# Patient Record
Sex: Female | Born: 1970 | Race: Black or African American | Hispanic: No | Marital: Single | Smoking: Never smoker
Health system: Southern US, Community
[De-identification: ages and names within clinical notes are randomized; demographics above are authoritative.]

## PROBLEM LIST (undated history)

## (undated) DIAGNOSIS — I10 Essential (primary) hypertension: Secondary | ICD-10-CM

## (undated) DIAGNOSIS — E119 Type 2 diabetes mellitus without complications: Secondary | ICD-10-CM

## (undated) DIAGNOSIS — J45909 Unspecified asthma, uncomplicated: Secondary | ICD-10-CM

---

## 2014-01-09 ENCOUNTER — Encounter (HOSPITAL_BASED_OUTPATIENT_CLINIC_OR_DEPARTMENT_OTHER): Payer: Self-pay | Admitting: Emergency Medicine

## 2014-01-09 ENCOUNTER — Emergency Department (HOSPITAL_BASED_OUTPATIENT_CLINIC_OR_DEPARTMENT_OTHER)
Admission: EM | Admit: 2014-01-09 | Discharge: 2014-01-09 | Disposition: A | Discharge status: Home / Self Care | Payer: Self-pay | Attending: Emergency Medicine | Admitting: Emergency Medicine

## 2014-01-09 DIAGNOSIS — Z79899 Other long term (current) drug therapy: Secondary | ICD-10-CM | POA: Insufficient documentation

## 2014-01-09 DIAGNOSIS — IMO0002 Reserved for concepts with insufficient information to code with codable children: Secondary | ICD-10-CM | POA: Insufficient documentation

## 2014-01-09 DIAGNOSIS — I10 Essential (primary) hypertension: Secondary | ICD-10-CM | POA: Insufficient documentation

## 2014-01-09 DIAGNOSIS — Z792 Long term (current) use of antibiotics: Secondary | ICD-10-CM | POA: Insufficient documentation

## 2014-01-09 DIAGNOSIS — E119 Type 2 diabetes mellitus without complications: Secondary | ICD-10-CM | POA: Insufficient documentation

## 2014-01-09 DIAGNOSIS — Z794 Long term (current) use of insulin: Secondary | ICD-10-CM | POA: Insufficient documentation

## 2014-01-09 DIAGNOSIS — J45909 Unspecified asthma, uncomplicated: Secondary | ICD-10-CM | POA: Insufficient documentation

## 2014-01-09 DIAGNOSIS — J020 Streptococcal pharyngitis: Secondary | ICD-10-CM | POA: Insufficient documentation

## 2014-01-09 HISTORY — DX: Type 2 diabetes mellitus without complications: E11.9

## 2014-01-09 HISTORY — DX: Unspecified asthma, uncomplicated: J45.909

## 2014-01-09 HISTORY — DX: Essential (primary) hypertension: I10

## 2014-01-09 LAB — RAPID STREP SCREEN (MED CTR MEBANE ONLY): Streptococcus, Group A Screen (Direct): POSITIVE — AB

## 2014-01-09 MED ORDER — OXYCODONE-ACETAMINOPHEN 5-325 MG PO TABS: 1.0000 | ORAL_TABLET | ORAL | Status: DC | PRN

## 2014-01-09 MED ORDER — PENICILLIN V POTASSIUM 500 MG PO TABS: 1000.0000 mg | ORAL_TABLET | Freq: Two times a day (BID) | ORAL | Status: DC

## 2014-01-09 MED ORDER — PREDNISONE 20 MG PO TABS: 60.0000 mg | ORAL_TABLET | Freq: Every day | ORAL | Status: AC

## 2014-01-09 NOTE — ED Notes (Signed)
PA at bedside.

## 2014-01-09 NOTE — Discharge Instructions (Signed)
Use tylenol or motrin for pain and fever. You may use Percocet as prescribed for severe pain. Take all antibiotics as prescribed, until you've taken all of the antibiotics given to you. Stay well hydrated. You will be given Prednisone to help with your tonsil swelling and pain. Take this at breakfast time for the next three days. Follow up in one week with a primary care doctor, using the resource guide below to find one. Return to the emergency department if your pain worsens, you cannot swallow and are drooling, or you have difficulty breathing.   Pharyngitis Pharyngitis is redness, pain, and swelling (inflammation) of your pharynx.  CAUSES  Pharyngitis is usually caused by infection. Most of the time, these infections are from viruses (viral) and are part of a cold. However, sometimes pharyngitis is caused by bacteria (bacterial). Pharyngitis can also be caused by allergies. Viral pharyngitis may be spread from person to person by coughing, sneezing, and personal items or utensils (cups, forks, spoons, toothbrushes). Bacterial pharyngitis may be spread from person to person by more intimate contact, such as kissing.  SIGNS AND SYMPTOMS  Symptoms of pharyngitis include:   Sore throat.   Tiredness (fatigue).   Low-grade fever.   Headache.  Joint pain and muscle aches.  Skin rashes.  Swollen lymph nodes.  Plaque-like film on throat or tonsils (often seen with bacterial pharyngitis). DIAGNOSIS  Your health care provider will ask you questions about your illness and your symptoms. Your medical history, along with a physical exam, is often all that is needed to diagnose pharyngitis. Sometimes, a rapid strep test is done. Other lab tests may also be done, depending on the suspected cause.  TREATMENT  Viral pharyngitis will usually get better in 3-4 days without the use of medicine. Bacterial pharyngitis is treated with medicines that kill germs (antibiotics).  HOME CARE INSTRUCTIONS    Drink enough water and fluids to keep your urine clear or pale yellow.   Only take over-the-counter or prescription medicines as directed by your health care provider:   If you are prescribed antibiotics, make sure you finish them even if you start to feel better.   Do not take aspirin.   Get lots of rest.   Gargle with 8 oz of salt water ( tsp of salt per 1 qt of water) as often as every 1-2 hours to soothe your throat.   Throat lozenges (if you are not at risk for choking) or sprays may be used to soothe your throat. SEEK MEDICAL CARE IF:   You have large, tender lumps in your neck.  You have a rash.  You cough up green, yellow-brown, or bloody spit. SEEK IMMEDIATE MEDICAL CARE IF:   Your neck becomes stiff.  You drool or are unable to swallow liquids.  You vomit or are unable to keep medicines or liquids down.  You have severe pain that does not go away with the use of recommended medicines.  You have trouble breathing (not caused by a stuffy nose). MAKE SURE YOU:   Understand these instructions.  Will watch your condition.  Will get help right away if you are not doing well or get worse. Document Released: 07/09/2005 Document Revised: 04/29/2013 Document Reviewed: 03/16/2013 [facility] Patient Information 2015 [place], [place]. This information is not intended to replace advice given to you by your health care provider. Make sure you discuss any questions you have with your health care provider.  Emergency Department Resource Guide 1) Find a Doctor and Pay Out  of Pocket Although you won't have to find out who is covered by your insurance plan, it is a good idea to ask around and get recommendations. You will then need to call the office and see if the doctor you have chosen will accept you as a new patient and what types of options they offer for patients who are self-pay. Some doctors offer discounts or will set up payment plans for their patients who do not  have insurance, but you will need to ask so you aren't surprised when you get to your appointment.  2) Contact Your Local Health Department Not all health departments have doctors that can see patients for sick visits, but many do, so it is worth a call to see if yours does. If you don't know where your local health department is, you can check in your phone book. The CDC also has a tool to help you locate your state's health department, and many state websites also have listings of all of their local health departments.  3) Find a Walk-in Clinic If your illness is not likely to be very severe or complicated, you may want to try a walk in clinic. These are popping up all over the country in pharmacies, drugstores, and shopping centers. They're usually staffed by nurse practitioners or physician assistants that have been trained to treat common illnesses and complaints. They're usually fairly quick and inexpensive. However, if you have serious medical issues or chronic medical problems, these are probably not your best option.  No Primary Care Doctor: - Call Health Connect at  [phone] - they can help you locate a primary care doctor that  accepts your insurance, provides certain services, etc. - Physician Referral Service- [phone]  Chronic Pain Problems: Organization         Address  Phone   Notes  [facility]  [phone]) [phone] Patients need to be referred by their primary care doctor.   Medication Assistance: Organization         Address  Phone   Notes  [facility] Medication [facility] [address] [place], [place] [address] [phone] --Must be a resident of [facility] [place] -- Must have NO insurance coverage whatsoever (no Medicaid/ Medicare, etc.) -- The pt. MUST have a primary care doctor that directs their care regularly and follows them in the community   MedAssist  [phone]   [employer]  [phone]    Agencies that  provide inexpensive medical care: Organization         Address  Phone   Notes  [facility]  [phone]   [facility]    [phone]   [facility] [address] [place], [place] [address] [phone]   [facility] [place] [address], [place] [phone]   Planned Parenthood    [phone]) [phone]   [facility]    [phone]) [phone]   [facility]  [address], [place] Phone:  [phone]) [phone], Fax:  [phone] Hours of Operation:  9 am - 6 pm, M-F.  Also accepts Medicaid/Medicare and self-pay.  [facility]  [address], [place] Phone: [phone], Fax: [phone]. Hours of Operation:  8:30 am - 5:30 pm, M-F.  Also accepts Medicaid and self-pay.  [facility] [place] [address], [place] Phone: [phone](336) [phone]   [facility] [address], [place], [place] [phone], Ext. [phone] Mondays & Thursdays: 7-9 AM.  First 15 patients are seen on a first come, first serve basis.    Medicaid-accepting [facility] Providers:  Organization         Address  Phone   Notes  [facility] [address], [place] [phone] Also accepts self-pay patients.  [facility] [address], [place]  [phone]) [phone]   [facility] [address], [place] [phone]   [facility] [address] [place] [address], [place] [phone]) [phone]   Renaye RakersVeita Bland [address], [place]   [phone] Only accepts [place] Access [place] patients after they have their name applied to their card.   Self-Pay (no insurance) in [facility]:  Organization         Address  Phone   Notes  Sickle Cell Patients, [facility] [address], [place] [phone]) [phone]   [facility] [address], [place] [phone]) [phone]   [facility] [place]  [address], [place] [phone]   [facility]/Dr. Osei-Bonsu  [address] [place] [address], [place] or [phone], [place] [phone] Phone number for both [place] and [place] locations is the same.  [facility] and [facility] [address], [place] [phone]   [facility] [place] [address] [place] [address], [place] or [address] [phone]) [phone]   [facility] [address], [place] [phone], phone; [phone]) [phone], fax Sees patients 1st and 3rd Saturday of every month.  Must not qualify for public or private insurance (i.e. Medicaid, Medicare, [place] Health Choice, Veterans' Benefits)  Household income should be no more than 200% of the poverty level The clinic cannot treat you if you are pregnant or think you are pregnant  Sexually transmitted diseases are not treated at the clinic.    Dental Care: Organization         Address  Phone  Notes  [facility] Department of [facility] [address], [place] [phone] Accepts children up to age 41 who are enrolled in [place]icaid or [place] Health Choice; pregnant women with a Medicaid card; and children who have applied for Medicaid or [place] Health Choice, but were declined, whose parents can pay a reduced fee at time of service.  [facility] of [facility] [place]  [address], [place] [phone] Accepts children up to age 41 who are enrolled in [place] or [place] Health Choice; pregnant women with a Medicaid card; and children who have applied for Medicaid or [place] Health Choice, but were declined, whose parents can pay a reduced fee at time of service.  [facility]  [address], [place] [phone] Patients are seen by appointment only. Walk-ins  are not accepted. [facility] will see patients 10518 years of age and older. Monday - Tuesday (8am-5pm) Most Wednesdays (8:30-5pm) $30 per visit, cash only  [facility]  [address], [facility] [phone] Patients are seen by appointment only. Walk-ins are not accepted. [facility] will see patients 41 years of age and older. One Wednesday Evening (Monthly: Volunteer Based).  $30 per visit, cash  only  [employer] of Dentistry Clinics  [phone] for adults; Children under age 8, call Graduate Pediatric Dentistry at [phone]. Children aged 64-14, please call [phone] to request a pediatric application.  Dental services are provided in all areas of dental care including fillings, crowns and bridges, complete and partial dentures, implants, gum treatment, root canals, and extractions. Preventive care is also provided. Treatment is provided to both adults and children. Patients are selected via a lottery and there is often a waiting list.   [facility] [address] [place]  [phone] [email or web address]   [facility] [address] [place], [place] [phone], Ext. [phone] Second and Fourth Thursday of each month, opens at 6:30 AM; Clinic ends at 9 AM.  Patients are seen on a first-come first-served basis, and a limited number are seen during each clinic.   [facility]  [address] [place], [place] [phone]   Eligibility Requirements You must have lived in [place], [place], or [place] counties for at least the last three months.   You cannot be eligible for state or federal sponsored [place], including [employer], [place], or [employer].   You generally cannot be eligible for healthcare insurance through your employer.    How to apply: Eligibility screenings are held every Tuesday and Wednesday afternoon from 1:00 pm until 4:00 pm. You do not need an appointment  for the interview!  [facility] [address], [place], [place] [phone]   [facility] Health Department  [phone]   [facility] Health Department  [phone]   [facility] Health Department  [phone]    Behavioral Health Resources in the Community: Intensive Outpatient Programs Organization         Address  Phone  Notes  [facility] [address] [place], [place], [place] [phone]   [facility] [place] [facility] [address] [place], [place] [phone]   [facility] [address] [place], [place]  [phone]   [facility] [address]  [place], [place] [phone] or [phone]   Substance Abuse Resources Organization         Address  Phone  Notes  [facility] Drug Services  [phone]   [facility]  [phone]   [facility]  [phone]   Floydene Flock  [phone]   Residential & Outpatient Substance Abuse Program  [phone]   Psychological Services Organization         Address  Phone  Notes  [facility]  [phone]   [facility]  [phone]   [facility] [address] [place] [phone] or [phone]    Mobile Crisis Teams Organization         Address  Phone  Notes  [facility], Mobile Crisis Care Unit  [phone]   [facility]  [address] [place], [place] [phone]   Doristine Locks [address] [place] [phone]    Self-Help/Support Groups Organization         Address  Phone             Notes  [facility] [place] - variety of support groups  336- I7437963 Call for more information  Narcotics Anonymous (NA), [facility] [address], [employer] [place]  2 meetings at this location   Statistician  Address  Phone  Notes  [facility] Residential  Treatment [address],    [place]  [phone]   [facility]  [address], [place] 161096, [place], [place]   Salcha Rogers, [place] [phone] Admissions: 8am-3pm M-F  Incentives Substance [place] [address],    [place], [place] [phone]   [facility] [address] [place], [place], [place]   [facility] [address]  [place], [place]   [facility] Programs - Intensive Outpatient [address], [place], [place]   [facility] ([place] [place].,  [place], [place] [phone] or [phone]   [facility] (RTS) [address] [place], [place] Accepts Medicaid  [facility] [place] [address].,  [place] [phone] Substance Abuse/Addiction Treatment   [facility] Organization         Address  Phone  Notes  [facility]  [phone]   Domenic Schwab, PhD [address] Arlis Porta [place], [place]   [phone] or [phone]   [place] [address] [place], [place] [phone]   [facility] [address] [place], [place] [phone] Insurance/Medicaid/sponsorship through [facility] and Families [address]                                    [place], [place] [phone] [place] [address], [place] [phone]    Dr. Adele Schilder  [phone]   [facility]. 1) [address] [place] 2) [place] 3)  [place] [address], [place] [phone]  [phone]   [place] [phone] or [phone] (After Hours)

## 2014-01-09 NOTE — ED Notes (Signed)
Pt with sore throat, fever, aches for four days.

## 2014-01-09 NOTE — ED Provider Notes (Signed)
CSN: [id number]     Arrival date & time 01/09/14  1449 History   First MD Initiated Contact with Patient 01/09/14 1529     Chief Complaint  Patient presents with  . Sore Throat     (Consider location/radiation/quality/duration/timing/severity/associated sxs/prior Treatment) HPI Comments: Denise Solis is a 43 y.o. Female who presents today with 4 days of sore throat and hoarseness associated with neck soreness, rhinorrhea/congestion, and HA. Throat pain worse with coughing or eating, tried Tylenol and OTC lozenges/sprays with no relief. No known alleviating factors. Endorses fevers at home, TMax 102F. Endorses myalgias. Denies cough, abd pain, N/V/D, arthralgias, eye pain/discharge/itching, trismus, drooling, or paresthesias/weakness. 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 Has never had this before, UTD on vaccinations, and still has tonsils. Last dose of Tylenol was 3hrs PTA.  Patient is a 43 y.o. female presenting with pharyngitis. The history is provided by the patient. No language interpreter was used.  Sore Throat This is a new problem. The current episode started in the past 7 days. The problem occurs constantly. The problem has been unchanged. Associated symptoms include congestion, fatigue, a fever (Tmax 102 at home), headaches, myalgias, neck pain, a sore throat and swollen glands. Pertinent negatives include no abdominal pain, anorexia, arthralgias, chest pain, chills, coughing, joint swelling, nausea, numbness, rash, urinary symptoms, vomiting or weakness. The symptoms are aggravated by drinking, coughing and swallowing. She has tried acetaminophen, rest and sleep for the symptoms. The treatment provided mild relief.    Past Medical History  Diagnosis Date  . Hypertension   . Diabetes mellitus without complication   . Asthma    Past Surgical History  Procedure Laterality Date  . Cesarean section     History reviewed. No pertinent family history. History   Substance Use Topics  . Smoking status: Never Smoker   . Smokeless tobacco: Not on file  . Alcohol Use: No   OB History   Grav Para Term Preterm Abortions TAB SAB Ect Mult Living                 Review of Systems  Constitutional: Positive for fever (Tmax 102 at home) and fatigue. Negative for chills and appetite change.  HENT: Positive for congestion, rhinorrhea, sinus pressure, sore throat and voice change. Negative for drooling, ear pain, mouth sores, nosebleeds, postnasal drip, sneezing and trouble swallowing (pain with swallowing, but able to control secretions).   Eyes: Negative for photophobia, pain, discharge, redness, itching and visual disturbance.  Respiratory: Negative for cough, choking, chest tightness, shortness of breath, wheezing and stridor.   Cardiovascular: Negative for chest pain.  Gastrointestinal: Negative for nausea, vomiting, abdominal pain, diarrhea, constipation and anorexia.  Musculoskeletal: Positive for myalgias and neck pain. Negative for arthralgias, joint swelling and neck stiffness.  Skin: Negative for rash.  Neurological: Positive for headaches. Negative for dizziness, weakness and numbness.  10 Systems reviewed and are negative for acute change except as noted in the HPI.     Allergies  Review of patient's allergies indicates no known allergies.  Home Medications   Prior to Admission medications   Medication Sig Start Date End Date Taking? Authorizing Provider  albuterol (PROVENTIL HFA;VENTOLIN HFA) 108 (90 BASE) MCG/ACT inhaler Inhale into the lungs every 6 (six) hours as needed for wheezing or shortness of breath.   Yes Historical Provider, MD  amLODipine (NORVASC) 5 MG tablet Take 5 mg by mouth daily.   Yes Historical Provider, MD  beclomethasone (QVAR) 40 MCG/ACT inhaler Inhale  into the lungs 2 (two) times daily.   Yes Historical Provider, MD  ferrous sulfate 325 (65 FE) MG tablet Take 325 mg by mouth daily with breakfast.   Yes Historical  Provider, MD  gabapentin (NEURONTIN) 300 MG capsule Take 300 mg by mouth 3 (three) times daily.   Yes Historical Provider, MD  glipiZIDE (GLUCOTROL XL) 10 MG 24 hr tablet Take 10 mg by mouth daily with breakfast.   Yes Historical Provider, MD  insulin aspart (NOVOLOG) 100 UNIT/ML injection Inject into the skin as needed for high blood sugar.   Yes Historical Provider, MD  insulin detemir (LEVEMIR) 100 UNIT/ML injection Inject into the skin at bedtime.   Yes Historical Provider, MD  losartan (COZAAR) 50 MG tablet Take 50 mg by mouth daily.   Yes Historical Provider, MD  ranitidine (ZANTAC) 150 MG tablet Take 150 mg by mouth 2 (two) times daily.   Yes Historical Provider, MD  oxyCODONE-acetaminophen (PERCOCET) 5-325 MG per tablet Take 1-2 tablets by mouth every 4 (four) hours as needed for severe pain. 01/09/14   Mercedes Strupp Camprubi-Soms, PA-C  penicillin v potassium (VEETID) 500 MG tablet Take 2 tablets (1,000 mg total) by mouth 2 (two) times daily. X 7 days 01/09/14   Donnita FallsMercedes Strupp Camprubi-Soms, PA-C  predniSONE (DELTASONE) 20 MG tablet Take 3 tablets (60 mg total) by mouth daily with breakfast. X 3 days 01/09/14   Donnita FallsMercedes Strupp Camprubi-Soms, PA-C   BP 130/74  Pulse 104  Temp(Src) 98.6 F (37 C) (Oral)  Resp 20  Ht 5\' 1"  (1.549 m)  Wt 194 lb (87.998 kg)  BMI 36.67 kg/m2  SpO2 100%  LMP 12/26/2013 Physical Exam  Nursing note and vitals reviewed. Constitutional: She is oriented to person, place, and time. Vital signs are normal. She appears well-developed and well-nourished. No distress.  WDWN in NAD, but appears uncomfortable  HENT:  Head: Normocephalic and atraumatic.  Right Ear: Tympanic membrane, external ear and ear canal normal.  Left Ear: Tympanic membrane, external ear and ear canal normal.  Nose: Mucosal edema and rhinorrhea present. No sinus tenderness or septal deviation. No epistaxis. Right sinus exhibits no maxillary sinus tenderness and no frontal sinus tenderness.  Left sinus exhibits no maxillary sinus tenderness and no frontal sinus tenderness.  Mouth/Throat: Uvula is midline and mucous membranes are normal. No trismus in the jaw. No uvula swelling. Oropharyngeal exudate and posterior oropharyngeal erythema present. No tonsillar abscesses.  Homer/AT, b/l ear canals clear with no erythema or edema, no pain with tugging on pinna, b/l TMs free of erythema and good landmarks seen.  B/L nasal turbinates edematous and erythematous with clear rhinorrhea. Maxillary and frontal sinuses non-tender b/l. Uvula midline, tonsils erythematous and trace exudates on right tonsil, no peritonsillar abscess noted, no trismus or muffled voice. Voice is hoarse.  Eyes: Conjunctivae and EOM are normal. Pupils are equal, round, and reactive to light. Right eye exhibits no discharge. Left eye exhibits no discharge.  Neck: Normal range of motion. Neck supple. No spinous process tenderness and no muscular tenderness present. No rigidity.  Cardiovascular: Normal rate, regular rhythm, normal heart sounds and intact distal pulses.   Pulmonary/Chest: Effort normal and breath sounds normal. No accessory muscle usage or stridor. No respiratory distress. She has no wheezes. She has no rhonchi. She has no rales.  CTAB in all lung fields. No stridor.  Musculoskeletal: Normal range of motion.  Lymphadenopathy:       Head (right side): Submental, submandibular and tonsillar adenopathy present. No  preauricular, no posterior auricular and no occipital adenopathy present.       Head (left side): Submental, submandibular and tonsillar adenopathy present. No preauricular, no posterior auricular and no occipital adenopathy present.    She has cervical adenopathy.       Right cervical: Superficial cervical and deep cervical adenopathy present. No posterior cervical adenopathy present.      Left cervical: Superficial cervical and deep cervical adenopathy present. No posterior cervical adenopathy present.        Right: No supraclavicular adenopathy present.       Left: No supraclavicular adenopathy present.  Diffuse anterior cervical LAD, exquisitely tender to palpation. Submental, submandibular, and tonsillar LAD. No posterior cervical, preauricular, postauricular, or occipital LAD.  Neurological: She is alert and oriented to person, place, and time.  Skin: Skin is warm, dry and intact. No rash noted.  Psychiatric: She has a normal mood and affect.    ED Course  Procedures (including critical care time) Labs Review Labs Reviewed  RAPID STREP SCREEN - Abnormal; Notable for the following:    Streptococcus, Group A Screen (Direct) POSITIVE (*)    All other components within normal limits    Imaging Review No results found.   EKG Interpretation None      MDM   Final diagnoses:  Strep pharyngitis    Denise Solis is a 43 y.o. female presenting with 4 days of sore throat, congestion, and URI symptoms. Rapid strep sent, but high suspicion based on CENTOR criteria. Low clinical suspicion for peritonsillar abscess at this time.   4:30 PM Rapid strep +. Will give RX for PCN VK and prednisone burst to help with pain, as well as Percocet. Advised good oral hydration. Pt given return precautions. Follow up with PCP in 3-5days. Pt understands and agrees with plan. Stable at d/c.  BP 130/74  Pulse 104  Temp(Src) 98.6 F (37 C) (Oral)  Resp 20  Ht 5\' 1"  (1.549 m)  Wt 194 lb (87.998 kg)  BMI 36.67 kg/m2  SpO2 100%  LMP 12/26/2013     Donnita Falls Camprubi-Soms, PA-C 01/09/14 1634

## 2014-01-09 NOTE — ED Notes (Signed)
Pt reports fever. Sts taking tylenol with some relief.  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  Reports pain in throat.

## 2014-01-11 NOTE — ED Provider Notes (Signed)
Medical screening examination/treatment/procedure(s) were performed by non-physician practitioner and as supervising physician I was immediately available for consultation/collaboration.   EKG Interpretation None       Hurman HornJohn M Bednar, MD 01/11/14 1421

## 2014-07-23 ENCOUNTER — Encounter (HOSPITAL_BASED_OUTPATIENT_CLINIC_OR_DEPARTMENT_OTHER): Payer: Self-pay | Admitting: *Deleted

## 2014-07-23 ENCOUNTER — Emergency Department (HOSPITAL_BASED_OUTPATIENT_CLINIC_OR_DEPARTMENT_OTHER)
Admission: EM | Admit: 2014-07-23 | Discharge: 2014-07-23 | Disposition: A | Discharge status: Home / Self Care | Payer: Self-pay | Attending: Emergency Medicine | Admitting: Emergency Medicine

## 2014-07-23 DIAGNOSIS — J45909 Unspecified asthma, uncomplicated: Secondary | ICD-10-CM | POA: Insufficient documentation

## 2014-07-23 DIAGNOSIS — K088 Other specified disorders of teeth and supporting structures: Secondary | ICD-10-CM | POA: Insufficient documentation

## 2014-07-23 DIAGNOSIS — K0889 Other specified disorders of teeth and supporting structures: Secondary | ICD-10-CM

## 2014-07-23 DIAGNOSIS — E119 Type 2 diabetes mellitus without complications: Secondary | ICD-10-CM | POA: Insufficient documentation

## 2014-07-23 DIAGNOSIS — Z79899 Other long term (current) drug therapy: Secondary | ICD-10-CM | POA: Insufficient documentation

## 2014-07-23 DIAGNOSIS — I10 Essential (primary) hypertension: Secondary | ICD-10-CM | POA: Insufficient documentation

## 2014-07-23 DIAGNOSIS — Z794 Long term (current) use of insulin: Secondary | ICD-10-CM | POA: Insufficient documentation

## 2014-07-23 DIAGNOSIS — Z7951 Long term (current) use of inhaled steroids: Secondary | ICD-10-CM | POA: Insufficient documentation

## 2014-07-23 MED ORDER — OXYCODONE-ACETAMINOPHEN 5-325 MG PO TABS
1.0000 | ORAL_TABLET | Freq: Once | ORAL | Status: AC
  Administered 2014-07-23: 1 via ORAL
  Filled 2014-07-23: qty 1

## 2014-07-23 MED ORDER — OXYCODONE-ACETAMINOPHEN 5-325 MG PO TABS: 1.0000 | ORAL_TABLET | Freq: Four times a day (QID) | ORAL | Status: AC | PRN

## 2014-07-23 MED ORDER — PENICILLIN V POTASSIUM 500 MG PO TABS: 500.0000 mg | ORAL_TABLET | Freq: Three times a day (TID) | ORAL | Status: AC

## 2014-07-23 NOTE — ED Notes (Signed)
C/o lower left toothache x 4 days.

## 2014-07-23 NOTE — ED Provider Notes (Signed)
CSN: [id number]     Arrival date & time 07/23/14  0920 History   First MD Initiated Contact with Patient 07/23/14 [id number]     Chief Complaint  Patient presents with  . Dental Pain     (Consider location/radiation/quality/duration/timing/severity/associated sxs/prior Treatment) HPI  Pt presenting with c/o left lower dental pain.  Pain has been present for the past 4 days.  2 teeth have been broken off in that area, but several days ago she bit into something and the pain has been constant and worseing since that time.  No swelling under tongue.  No fever.  No difficulty breathing or swallowing.  Pain is constant but worse with cold liquids and chewing.  No fever/chills.  There are no other associated systemic symptoms, there are no other alleviating or modifying factors.   Past Medical History  Diagnosis Date  . Hypertension   . Diabetes mellitus without complication   . Asthma    Past Surgical History  Procedure Laterality Date  . Cesarean section     No family history on file. History  Substance Use Topics  . Smoking status: Never Smoker   . Smokeless tobacco: Not on file  . Alcohol Use: No   OB History    No data available     Review of Systems  ROS reviewed and all otherwise negative except for mentioned in HPI    Allergies  Review of patient's allergies indicates no known allergies.  Home Medications   Prior to Admission medications   Medication Sig Start Date End Date Taking? Authorizing Provider  albuterol (PROVENTIL HFA;VENTOLIN HFA) 108 (90 BASE) MCG/ACT inhaler Inhale into the lungs every 6 (six) hours as needed for wheezing or shortness of breath.   Yes Historical Provider, MD  amLODipine (NORVASC) 5 MG tablet Take 5 mg by mouth daily.   Yes Historical Provider, MD  beclomethasone (QVAR) 40 MCG/ACT inhaler Inhale into the lungs 2 (two) times daily.   Yes Historical Provider, MD  ferrous sulfate 325 (65 FE) MG tablet Take 325 mg by mouth daily with breakfast.    Yes Historical Provider, MD  gabapentin (NEURONTIN) 300 MG capsule Take 300 mg by mouth 3 (three) times daily.   Yes Historical Provider, MD  glipiZIDE (GLUCOTROL XL) 10 MG 24 hr tablet Take 10 mg by mouth daily with breakfast.   Yes Historical Provider, MD  insulin aspart (NOVOLOG) 100 UNIT/ML injection Inject into the skin as needed for high blood sugar.   Yes Historical Provider, MD  insulin detemir (LEVEMIR) 100 UNIT/ML injection Inject into the skin at bedtime.   Yes Historical Provider, MD  losartan (COZAAR) 50 MG tablet Take 50 mg by mouth daily.   Yes Historical Provider, MD  ranitidine (ZANTAC) 150 MG tablet Take 150 mg by mouth 2 (two) times daily.   Yes Historical Provider, MD  sulfamethoxazole-trimethoprim (BACTRIM DS,SEPTRA DS) 800-160 MG per tablet Take 1 tablet by mouth 2 (two) times daily.   Yes Historical Provider, MD  oxyCODONE-acetaminophen (PERCOCET/ROXICET) 5-325 MG per tablet Take 1-2 tablets by mouth every 6 (six) hours as needed for severe pain. 07/23/14   Ethelda Chick, MD  penicillin v potassium (VEETID) 500 MG tablet Take 1 tablet (500 mg total) by mouth 3 (three) times daily. 07/23/14   Ethelda Chick, MD  predniSONE (DELTASONE) 20 MG tablet Take 3 tablets (60 mg total) by mouth daily with breakfast. X 3 days 01/09/14   Don[place]ita [place] Camprubi-Soms, PA-C   BP 134/86 mmHg  Pulse 103  Temp(Src) 98.8 F (37.1 C) (Oral)  Resp 18  Ht  (1.549 m)  Wt 178 lb (80.74 kg)  BMI 33.65 kg/m2  SpO2 100%  LMP 07/20/2014  Vitals reviewed Physical Exam  Physical Examination: General appearance - alert, well appearing, and in no distress Mental status - alert, oriented to person, place, and time Eyes - no conjunctival injection, no scleral icterus Mouth - mucous membranes moist, pharynx normal without lesions, several eroded teeth down to gum line in left lower mandibular area, no swelling under tongue Neck - supple, no significant adenopathy Chest - clear to auscultation,  no wheezes, rales or rhonchi, symmetric air entry Heart - normal rate, regular rhythm, normal S1, S2, no murmurs, rubs, clicks or gallops Extremities - peripheral pulses normal, no pedal edema, no clubbing or cyanosis Skin - normal coloration and turgor, no rashes  ED Course  Procedures (including critical care time) Labs Review Labs Reviewed - No data to display  Imaging Review No results found.   EKG Interpretation None      MDM   Final diagnoses:  Pain, dental    Pt presenting with c/o left lower dental pain.  Pt has erosion and dental decay of left lower molars.  No gingival abscess.  No swelling under tongue.  Pt started on antibiotics, given pain medicaiton and strongly encouraged to arrange for followup with dentist.  Discharged with strict return precautions.  Pt agreeable with plan.    Ethelda Chick, MD 07/24/14 [phone]

## 2014-07-23 NOTE — Discharge Instructions (Signed)
Return to the ED with any concerns including difficulty breathing or swallowing, vomiting and not able to keep down liquids or antibiotics, decreased level of alertness/lethargy, or any other alarming symptoms °

## 2015-02-13 ENCOUNTER — Emergency Department (HOSPITAL_BASED_OUTPATIENT_CLINIC_OR_DEPARTMENT_OTHER)
Admission: EM | Admit: 2015-02-13 | Discharge: 2015-02-13 | Disposition: A | Discharge status: Home / Self Care | Payer: Self-pay | Attending: Emergency Medicine | Admitting: Emergency Medicine

## 2015-02-13 ENCOUNTER — Encounter (HOSPITAL_BASED_OUTPATIENT_CLINIC_OR_DEPARTMENT_OTHER): Payer: Self-pay | Admitting: *Deleted

## 2015-02-13 DIAGNOSIS — Z7951 Long term (current) use of inhaled steroids: Secondary | ICD-10-CM | POA: Insufficient documentation

## 2015-02-13 DIAGNOSIS — Z794 Long term (current) use of insulin: Secondary | ICD-10-CM | POA: Insufficient documentation

## 2015-02-13 DIAGNOSIS — G629 Polyneuropathy, unspecified: Secondary | ICD-10-CM | POA: Insufficient documentation

## 2015-02-13 DIAGNOSIS — J45909 Unspecified asthma, uncomplicated: Secondary | ICD-10-CM | POA: Insufficient documentation

## 2015-02-13 DIAGNOSIS — Z79899 Other long term (current) drug therapy: Secondary | ICD-10-CM | POA: Insufficient documentation

## 2015-02-13 DIAGNOSIS — E119 Type 2 diabetes mellitus without complications: Secondary | ICD-10-CM | POA: Insufficient documentation

## 2015-02-13 DIAGNOSIS — I10 Essential (primary) hypertension: Secondary | ICD-10-CM | POA: Insufficient documentation

## 2015-02-13 MED ORDER — TRAMADOL HCL 50 MG PO TABS: 50.0000 mg | ORAL_TABLET | Freq: Four times a day (QID) | ORAL | Status: AC | PRN

## 2015-02-13 MED ORDER — MORPHINE SULFATE 4 MG/ML IJ SOLN
4.0000 mg | Freq: Once | INTRAMUSCULAR | Status: AC
  Administered 2015-02-13: 4 mg via INTRAMUSCULAR
  Filled 2015-02-13: qty 1

## 2015-02-13 NOTE — ED Notes (Signed)
Pt c/o right foot pain w/o injury x 3 days 

## 2015-02-13 NOTE — Discharge Instructions (Signed)
Diabetic Neuropathy Diabetic neuropathy is a nerve disease or nerve damage that is caused by diabetes mellitus. About half of all people with diabetes mellitus have some form of nerve damage. Nerve damage is more common in those who have had diabetes mellitus for many years and who generally have not had good control of their blood sugar (glucose) level. Diabetic neuropathy is a common complication of diabetes mellitus. There are three more common types of diabetic neuropathy and a fourth type that is less common and less understood:   Peripheral neuropathy--This is the most common type of diabetic neuropathy. It causes damage to the nerves of the feet and legs first and then eventually the hands and arms.The damage affects the ability to sense touch.  Autonomic neuropathy--This type causes damage to the autonomic nervous system, which controls the following functions:  Heartbeat.  Body temperature.  Blood pressure.  Urination.  Digestion.  Sweating.  Sexual function.  Focal neuropathy--Focal neuropathy can be painful and unpredictable and occurs most often in older adults with diabetes mellitus. It involves a specific nerve or one area and often comes on suddenly. It usually does not cause long-term problems.  Radiculoplexus neuropathy-- Sometimes called lumbosacral radiculoplexus neuropathy, radiculoplexus neuropathy affects the nerves of the thighs, hips, buttocks, or legs. It is more common in people with type 2 diabetes mellitus and in older men. It is characterized by debilitating pain, weakness, and atrophy, usually in the thigh muscles. CAUSES  The cause of peripheral, autonomic, and focal neuropathies is diabetes mellitus that is uncontrolled and high glucose levels. The cause of radiculoplexus neuropathy is unknown. However, it is thought to be caused by inflammation related to uncontrolled glucose levels. SIGNS AND SYMPTOMS  Peripheral Neuropathy Peripheral neuropathy develops  slowly over time. When the nerves of the feet and legs no longer work there may be:   Burning, stabbing, or aching pain in the legs or feet.  Inability to feel pressure or pain in your feet. This can lead to:  Thick calluses over pressure areas.  Pressure sores.  Ulcers.  Foot deformities.  Reduced ability to feel temperature changes.  Muscle weakness. Autonomic Neuropathy The symptoms of autonomic neuropathy vary depending on which nerves are affected. Symptoms may include:  Problems with digestion, such as:  Feeling sick to your stomach (nausea).  Vomiting.  Bloating.  Constipation.  Diarrhea.  Abdominal pain.  Difficulty with urination. This occurs if you lose your ability to sense when your bladder is full. Problems include:  Urine leakage (incontinence).  Inability to empty your bladder completely (retention).  Rapid or irregular heartbeat (palpitations).  Blood pressure drops when you stand up (orthostatic hypotension). When you stand up you may feel:  Dizzy.  Weak.  Faint.  In men, inability to attain and maintain an erection.  In women, vaginal dryness and problems with decreased sexual desire and arousal.  Problems with body temperature regulation.  Increased or decreased sweating. Focal Neuropathy  Abnormal eye movements or abnormal alignment of both eyes.  Weakness in the wrist.  Foot drop. This results in an inability to lift the foot properly and abnormal walking or foot movement.  Paralysis on one side of your face (Bell palsy).  Chest or abdominal pain. Radiculoplexus Neuropathy  Sudden, severe pain in your hip, thigh, or buttocks.  Weakness and wasting of thigh muscles.  Difficulty rising from a seated position.  Abdominal swelling.  Unexplained weight loss (usually more than 10 lb [4.5 kg]). DIAGNOSIS  Peripheral Neuropathy Your senses may   be tested. Sensory function testing can be done with:  A light touch using a  monofilament.  A vibration with tuning fork.  A sharp sensation with a pin prick. Other tests that can help diagnose neuropathy are:  Nerve conduction velocity. This test checks the transmission of an electrical current through a nerve.  Electromyography. This shows how muscles respond to electrical signals transmitted by nearby nerves.  Quantitative sensory testing. This is used to assess how your nerves respond to vibrations and changes in temperature. Autonomic Neuropathy Diagnosis is often based on reported symptoms. Tell your health care provider if you experience:   Dizziness.   Constipation.   Diarrhea.   Inappropriate urination or inability to urinate.   Inability to get or maintain an erection.  Tests that may be done include:   Electrocardiography or Holter monitor. These are tests that can help show problems with the heart rate or heart rhythm.   An X-ray exam may be done. Focal Neuropathy Diagnosis is made based on your symptoms and what your health care provider finds during your exam. Other tests may be done. They may include:  Nerve conduction velocities. This checks the transmission of electrical current through a nerve.  Electromyography. This shows how muscles respond to electrical signals transmitted by nearby nerves.  Quantitative sensory testing. This test is used to assess how your nerves respond to vibration and changes in temperature. Radiculoplexus Neuropathy  Often the first thing is to eliminate any other issue or problems that might be the cause, as there is no stick test for diagnosis.  X-ray exam of your spine and lumbar region.  Spinal tap to rule out cancer.  MRI to rule out other lesions. TREATMENT  Once nerve damage occurs, it cannot be reversed. The goal of treatment is to keep the disease or nerve damage from getting worse and affecting more nerve fibers. Controlling your blood glucose level is the key. Most people with  radiculoplexus neuropathy see at least a partial improvement over time. You will need to keep your blood glucose and HbA1c levels in the target range determined by your health care provider. Things that help control blood glucose levels include:   Blood glucose monitoring.   Meal planning.   Physical activity.   Diabetes medicine.  Over time, maintaining lower blood glucose levels helps lessen symptoms. Sometimes, prescription pain medicine is needed. HOME CARE INSTRUCTIONS:  Do not smoke.  Keep your blood glucose level in the range that you and your health care provider have determined acceptable for you.  Keep your blood pressure level in the range that you and your health care provider have determined acceptable for you.  Eat a well-balanced diet.  Be active every day.  Check your feet every day. SEEK MEDICAL CARE IF:   You have burning, stabbing, or aching pain in the legs or feet.  You are unable to feel pressure or pain in your feet.  You develop problems with digestion such as:  Nausea.  Vomiting.  Bloating.  Constipation.  Diarrhea.  Abdominal pain.  You have difficulty with urination, such as:  Incontinence.  Retention.  You have palpitations.  You develop orthostatic hypotension. When you stand up you may feel:  Dizzy.  Weak.  Faint.  You cannot attain and maintain an erection (in men).  You have vaginal dryness and problems with decreased sexual desire and arousal (in women).  You have severe pain in your thighs, legs, or buttocks.  You have unexplained weight loss.   Document Released: 09/17/2001 Document Revised: 04/29/2013 Document Reviewed: 12/18/2012 [employer] Patient Information 2015 [employer]. This information is not intended to replace advice given to you by your health care provider. Make sure you discuss any questions you have with your health care provider. 

## 2015-02-13 NOTE — ED Provider Notes (Signed)
CSN: [id number]     Arrival date & time 02/13/15  1204 History   First MD Initiated Contact with Patient 02/13/15 1321     Chief Complaint  Patient presents with  . Foot Pain     (Consider location/radiation/quality/duration/timing/severity/associated sxs/prior Treatment) HPI Comments: Patient with a history of diabetes presents with foot pain. She does have peripheral neuropathy in her feet. She always has more trouble with her right foot. She states that she is currently on gabapentin. She has worsening pain to her right foot over the last 3-4 days. She states is the same types of pain that she's had before with her neuropathy but it's worse over the last 3-4 days. She denies any injury to the foot. She has baseline numbness to both feet but without acute change. She denies any fevers. She describes it as a burning pain all over her foot.  Patient is a 44 y.o. female presenting with lower extremity pain.  Foot Pain Pertinent negatives include no headaches.    Past Medical History  Diagnosis Date  . Hypertension   . Diabetes mellitus without complication   . Asthma    Past Surgical History  Procedure Laterality Date  . Cesarean section     History reviewed. No pertinent family history. History  Substance Use Topics  . Smoking status: Never Smoker   . Smokeless tobacco: Not on file  . Alcohol Use: No   OB History    No data available     Review of Systems  Constitutional: Negative for fever.  Gastrointestinal: Negative for nausea and vomiting.  Musculoskeletal: Negative for back pain, joint swelling, arthralgias and neck pain.       Foot pain  Skin: Negative for wound.  Neurological: Negative for weakness, numbness and headaches.      Allergies  Review of patient's allergies indicates no known allergies.  Home Medications   Prior to Admission medications   Medication Sig Start Date End Date Taking? Authorizing Provider  albuterol (PROVENTIL HFA;VENTOLIN HFA)  108 (90 BASE) MCG/ACT inhaler Inhale into the lungs every 6 (six) hours as needed for wheezing or shortness of breath.    Historical Provider, MD  amLODipine (NORVASC) 5 MG tablet Take 5 mg by mouth daily.    Historical Provider, MD  beclomethasone (QVAR) 40 MCG/ACT inhaler Inhale into the lungs 2 (two) times daily.    Historical Provider, MD  ferrous sulfate 325 (65 FE) MG tablet Take 325 mg by mouth daily with breakfast.    Historical Provider, MD  gabapentin (NEURONTIN) 300 MG capsule Take 300 mg by mouth 3 (three) times daily.    Historical Provider, MD  glipiZIDE (GLUCOTROL XL) 10 MG 24 hr tablet Take 10 mg by mouth daily with breakfast.    Historical Provider, MD  insulin aspart (NOVOLOG) 100 UNIT/ML injection Inject into the skin as needed for high blood sugar.    Historical Provider, MD  insulin detemir (LEVEMIR) 100 UNIT/ML injection Inject into the skin at bedtime.    Historical Provider, MD  losartan (COZAAR) 50 MG tablet Take 50 mg by mouth daily.    Historical Provider, MD  oxyCODONE-acetaminophen (PERCOCET/ROXICET) 5-325 MG per tablet Take 1-2 tablets by mouth every 6 (six) hours as needed for severe pain. 07/23/14   Jerelyn Scott, MD  penicillin v potassium (VEETID) 500 MG tablet Take 1 tablet (500 mg total) by mouth 3 (three) times daily. 07/23/14   Jerelyn Scott, MD  predniSONE (DELTASONE) 20 MG tablet Take 3 tablets (60  mg total) by mouth daily with breakfast. X 3 days 01/09/14   Mercedes Camprubi-Soms, PA-C  ranitidine (ZANTAC) 150 MG tablet Take 150 mg by mouth 2 (two) times daily.    Historical Provider, MD  sulfamethoxazole-trimethoprim (BACTRIM DS,SEPTRA DS) 800-160 MG per tablet Take 1 tablet by mouth 2 (two) times daily.    Historical Provider, MD  traMADol (ULTRAM) 50 MG tablet Take 1 tablet (50 mg total) by mouth every 6 (six) hours as needed. 02/13/15   Rolan Bucco, MD   BP 143/87 mmHg  Pulse 103  Temp(Src) 98.4 F (36.9 C) (Oral)  Resp 18  Ht  (1.549 m)  Wt 184 lb  (83.462 kg)  BMI 34.78 kg/m2  SpO2 99% Physical Exam  Constitutional: She is oriented to person, place, and time. She appears well-developed and well-nourished.  HENT:  Head: Normocephalic and atraumatic.  Neck: Normal range of motion. Neck supple.  Cardiovascular: Normal rate.   Pulmonary/Chest: Effort normal.  Musculoskeletal: She exhibits no edema or tenderness.  Patient has a normal appearing foot. There is no swelling or erythema. There is generalized tenderness on palpation of the foot, primarily on the plantar surface of the foot. No wounds are noted. She has normal color with intact pedal pulses.  Neurological: She is alert and oriented to person, place, and time.  Skin: Skin is warm and dry.  Psychiatric: She has a normal mood and affect.    ED Course  Procedures (including critical care time) Labs Review Labs Reviewed - No data to display  Imaging Review No results found.   EKG Interpretation None      MDM   Final diagnoses:  Neuropathy    I don't see any evidence of infection to the foot. There is no suggestions of decreased perfusion. Her pain is likely a worsening of her neuropathy. She was given a dose of morphine in the ED and was given a prescription for tramadol to use at home. She states she has an upcoming appointment with her primary care physician on August 7.    Rolan Bucco, MD 02/13/15 [phone]

## 2015-05-29 ENCOUNTER — Emergency Department (HOSPITAL_BASED_OUTPATIENT_CLINIC_OR_DEPARTMENT_OTHER): Payer: Self-pay

## 2015-05-29 ENCOUNTER — Emergency Department (HOSPITAL_BASED_OUTPATIENT_CLINIC_OR_DEPARTMENT_OTHER)
Admission: EM | Admit: 2015-05-29 | Discharge: 2015-05-29 | Disposition: A | Discharge status: Home / Self Care | Payer: Self-pay | Attending: Emergency Medicine | Admitting: Emergency Medicine

## 2015-05-29 ENCOUNTER — Encounter (HOSPITAL_BASED_OUTPATIENT_CLINIC_OR_DEPARTMENT_OTHER): Payer: Self-pay | Admitting: Emergency Medicine

## 2015-05-29 DIAGNOSIS — M5431 Sciatica, right side: Secondary | ICD-10-CM | POA: Insufficient documentation

## 2015-05-29 DIAGNOSIS — R52 Pain, unspecified: Secondary | ICD-10-CM

## 2015-05-29 DIAGNOSIS — Z7951 Long term (current) use of inhaled steroids: Secondary | ICD-10-CM | POA: Insufficient documentation

## 2015-05-29 DIAGNOSIS — E119 Type 2 diabetes mellitus without complications: Secondary | ICD-10-CM | POA: Insufficient documentation

## 2015-05-29 DIAGNOSIS — Z791 Long term (current) use of non-steroidal anti-inflammatories (NSAID): Secondary | ICD-10-CM | POA: Insufficient documentation

## 2015-05-29 DIAGNOSIS — Z7952 Long term (current) use of systemic steroids: Secondary | ICD-10-CM | POA: Insufficient documentation

## 2015-05-29 DIAGNOSIS — Z794 Long term (current) use of insulin: Secondary | ICD-10-CM | POA: Insufficient documentation

## 2015-05-29 DIAGNOSIS — J45909 Unspecified asthma, uncomplicated: Secondary | ICD-10-CM | POA: Insufficient documentation

## 2015-05-29 DIAGNOSIS — Z79899 Other long term (current) drug therapy: Secondary | ICD-10-CM | POA: Insufficient documentation

## 2015-05-29 DIAGNOSIS — I10 Essential (primary) hypertension: Secondary | ICD-10-CM | POA: Insufficient documentation

## 2015-05-29 MED ORDER — HYDROCODONE-ACETAMINOPHEN 5-325 MG PO TABS: 1.0000 | ORAL_TABLET | ORAL | Status: AC | PRN

## 2015-05-29 MED ORDER — METHYLPREDNISOLONE 4 MG PO TBPK: ORAL_TABLET | Freq: Four times a day (QID) | ORAL | Status: AC

## 2015-05-29 NOTE — Discharge Instructions (Signed)
Suspected Sciatica °Sciatica is pain, weakness, numbness, or tingling along the path of the sciatic nerve. The nerve starts in the lower back and runs down the back of each leg. The nerve controls the muscles in the lower leg and in the back of the knee, while also providing sensation to the back of the thigh, lower leg, and the sole of your foot. Sciatica is a symptom of another medical condition. For instance, nerve damage or certain conditions, such as a herniated disk or bone spur on the spine, pinch or put pressure on the sciatic nerve. This causes the pain, weakness, or other sensations normally associated with sciatica. Generally, sciatica only affects one side of the body. °CAUSES  °· Herniated or slipped disc. °· Degenerative disk disease. °· A pain disorder involving the narrow muscle in the buttocks (piriformis syndrome). °· Pelvic injury or fracture. °· Pregnancy. °· Tumor (rare). °SYMPTOMS  °Symptoms can vary from mild to very severe. The symptoms usually travel from the low back to the buttocks and down the back of the leg. Symptoms can include: °· Mild tingling or dull aches in the lower back, leg, or hip. °· Numbness in the back of the calf or sole of the foot. °· Burning sensations in the lower back, leg, or hip. °· Sharp pains in the lower back, leg, or hip. °· Leg weakness. °· Severe back pain inhibiting movement. °These symptoms may get worse with coughing, sneezing, laughing, or prolonged sitting or standing. Also, being overweight may worsen symptoms. °DIAGNOSIS  °Your caregiver will perform a physical exam to look for common symptoms of sciatica. He or she may ask you to do certain movements or activities that would trigger sciatic nerve pain. Other tests may be performed to find the cause of the sciatica. These may include: °· Blood tests. °· X-rays. °· Imaging tests, such as an MRI or CT scan. °TREATMENT  °Treatment is directed at the cause of the sciatic pain. Sometimes, treatment is not  necessary and the pain and discomfort goes away on its own. If treatment is needed, your caregiver may suggest: °· Over-the-counter medicines to relieve pain. °· Prescription medicines, such as anti-inflammatory medicine, muscle relaxants, or narcotics. °· Applying heat or ice to the painful area. °· Steroid injections to lessen pain, irritation, and inflammation around the nerve. °· Reducing activity during periods of pain. °· Exercising and stretching to strengthen your abdomen and improve flexibility of your spine. Your caregiver may suggest losing weight if the extra weight makes the back pain worse. °· Physical therapy. °· Surgery to eliminate what is pressing or pinching the nerve, such as a bone spur or part of a herniated disk. °HOME CARE INSTRUCTIONS  °· Only take over-the-counter or prescription medicines for pain or discomfort as directed by your caregiver. °· Apply ice to the affected area for 20 minutes, 3-4 times a day for the first 48-72 hours. Then try heat in the same way. °· Exercise, stretch, or perform your usual activities if these do not aggravate your pain. °· Attend physical therapy sessions as directed by your caregiver. °· Keep all follow-up appointments as directed by your caregiver. °· Do not wear high heels or shoes that do not provide proper support. °· Check your mattress to see if it is too soft. A firm mattress may lessen your pain and discomfort. °SEEK IMMEDIATE MEDICAL CARE IF:  °· You lose control of your bowel or bladder (incontinence). °· You have increasing weakness in the lower back, pelvis,   buttocks, or legs. °· You have redness or swelling of your back. °· You have a burning sensation when you urinate. °· You have pain that gets worse when you lie down or awakens you at night. °· Your pain is worse than you have experienced in the past. °· Your pain is lasting longer than 4 weeks. °· You are suddenly losing weight without reason. °MAKE SURE YOU: °· Understand these  instructions. °· Will watch your condition. °· Will get help right away if you are not doing well or get worse. °  °This information is not intended to replace advice given to you by your health care provider. Make sure you discuss any questions you have with your health care provider. °  °Document Released: 07/03/2001 Document Revised: 03/30/2015 Document Reviewed: 11/18/2011 °Elsevier Interactive Patient Education ©2016 [employer] ° °

## 2015-05-29 NOTE — ED Notes (Signed)
Patient states that she has pain to her thigh radiating down to her right leg. The patient reports that this has been happening x 6 months and nothing has helped

## 2015-05-29 NOTE — ED Provider Notes (Signed)
CSN: [id number]     Arrival date & time 05/29/15  1658 History   By signing my name below, I, Terrance Branch, attest that this documentation has been prepared under the direction and in the presence of Arby Barrette, MD. Electronically Signed: Evon Slack, ED Scribe. 05/29/2015. 7:41 PM.    Chief Complaint  Patient presents with  . Hip Pain   The history is provided by the patient. No language interpreter was used.   HPI Comments: Perry Molla is a 44 y.o. female who presents to the Emergency Department complaining of constant burning right hip pain onset 6 months. Pt states that the pain radiates down into her thigh. Pt states that she was diagnosed with bursitis in the right hip and has been taking meloxicam with no relief. Pt states that the pain is worse when walking. She does report that she is constantly standing while at work. Denies fever chills, cough, CP or abdominal pain. There is no weakness or numbness of the leg. There is no bowel or bladder dysfunction.    Past Medical History  Diagnosis Date  . Hypertension   . Diabetes mellitus without complication (HCC)   . Asthma    Past Surgical History  Procedure Laterality Date  . Cesarean section     History reviewed. No pertinent family history. Social History  Substance Use Topics  . Smoking status: Never Smoker   . Smokeless tobacco: None  . Alcohol Use: No   OB History    No data available     Review of Systems 10 Systems reviewed and all are negative for acute change except as noted in the HPI.    Allergies  Review of patient's allergies indicates no known allergies.  Home Medications   Prior to Admission medications   Medication Sig Start Date End Date Taking? Authorizing Provider  meloxicam (MOBIC) 7.5 MG tablet Take 7.5 mg by mouth daily.   Yes Historical Provider, MD  albuterol (PROVENTIL HFA;VENTOLIN HFA) 108 (90 BASE) MCG/ACT inhaler Inhale into the lungs every 6 (six) hours as needed for  wheezing or shortness of breath.    Historical Provider, MD  amLODipine (NORVASC) 5 MG tablet Take 5 mg by mouth daily.    Historical Provider, MD  beclomethasone (QVAR) 40 MCG/ACT inhaler Inhale into the lungs 2 (two) times daily.    Historical Provider, MD  ferrous sulfate 325 (65 FE) MG tablet Take 325 mg by mouth daily with breakfast.    Historical Provider, MD  gabapentin (NEURONTIN) 300 MG capsule Take 300 mg by mouth 3 (three) times daily.    Historical Provider, MD  glipiZIDE (GLUCOTROL XL) 10 MG 24 hr tablet Take 10 mg by mouth daily with breakfast.    Historical Provider, MD  HYDROcodone-acetaminophen (NORCO/VICODIN) 5-325 MG tablet Take 1-2 tablets by mouth every 4 (four) hours as needed for moderate pain or severe pain. 05/29/15   Arby Barrette, MD  insulin aspart (NOVOLOG) 100 UNIT/ML injection Inject into the skin as needed for high blood sugar.    Historical Provider, MD  insulin detemir (LEVEMIR) 100 UNIT/ML injection Inject into the skin at bedtime.    Historical Provider, MD  losartan (COZAAR) 50 MG tablet Take 50 mg by mouth daily.    Historical Provider, MD  methylPREDNISolone (MEDROL DOSEPAK) 4 MG TBPK tablet Take by mouth taper from 4 doses each day to 1 dose and stop. 05/29/15   Arby Barrette, MD  oxyCODONE-acetaminophen (PERCOCET/ROXICET) 5-325 MG per tablet Take 1-2 tablets by mouth  every 6 (six) hours as needed for severe pain. 07/23/14   Jerelyn ScottMartha Linker, MD  penicillin v potassium (VEETID) 500 MG tablet Take 1 tablet (500 mg total) by mouth 3 (three) times daily. 07/23/14   Jerelyn ScottMartha Linker, MD  predniSONE (DELTASONE) 20 MG tablet Take 3 tablets (60 mg total) by mouth daily with breakfast. X 3 days 01/09/14   Mercedes Camprubi-Soms, PA-C  ranitidine (ZANTAC) 150 MG tablet Take 150 mg by mouth 2 (two) times daily.    Historical Provider, MD  sulfamethoxazole-trimethoprim (BACTRIM DS,SEPTRA DS) 800-160 MG per tablet Take 1 tablet by mouth 2 (two) times daily.    Historical Provider, MD   traMADol (ULTRAM) 50 MG tablet Take 1 tablet (50 mg total) by mouth every 6 (six) hours as needed. 02/13/15   Rolan BuccoMelanie Belfi, MD   BP 137/91 mmHg  Pulse 86  Temp(Src) 98.3 F (36.8 C) (Oral)  Resp 18  Ht 5\' 1"  (1.549 m)  Wt 185 lb (83.915 kg)  BMI 34.97 kg/m2  SpO2 100%  LMP 05/29/2015   Physical Exam  Constitutional: She is oriented to person, place, and time. She appears well-developed and well-nourished.  HENT:  Head: Normocephalic and atraumatic.  Eyes: EOM are normal. Pupils are equal, round, and reactive to light.  Neck: Neck supple.  Cardiovascular: Normal rate, regular rhythm, normal heart sounds and intact distal pulses.   Pulmonary/Chest: Effort normal and breath sounds normal.  Abdominal: Soft. Bowel sounds are normal. She exhibits no distension. There is no tenderness.  Musculoskeletal: Normal range of motion. She exhibits no edema.  Patient has some tenderness in the right buttock at the exit of the sciatic nerve bundle. Soft tissues of the leg are normal. Patient does have various varicosities of the lower extremities but none appear to be immediately thrombosed. None have erythema surrounding or firm tenderness. Positive pain with right straight leg raise above 45. Patient is able to lie supine, sitting up, stand and ambulate without motor limitation or gait limitation.  Neurological: She is alert and oriented to person, place, and time. She has normal strength. She exhibits normal muscle tone. Coordination normal. GCS eye subscore is 4. GCS verbal subscore is 5. GCS motor subscore is 6.  Skin: Skin is warm, dry and intact.  Psychiatric: She has a normal mood and affect.    ED Course  Procedures (including critical care time) DIAGNOSTIC STUDIES: Oxygen Saturation is 100% on RA, normal by my interpretation.    COORDINATION OF CARE: 5:51 PM-Discussed treatment plan with pt at bedside and pt agreed to plan.     Labs Review Labs Reviewed - No data to  display  Imaging Review Dg Pelvis 1-2 Views  05/29/2015  CLINICAL DATA:  Burning right hip pain for 6 months. Pain radiates down the right thigh. EXAM: RIGHT FEMUR 2 VIEWS; PELVIS - 1-2 VIEW COMPARISON:  None. FINDINGS: Pelvis: Both hips are normally located. Moderate degenerative changes for the patient's age. No fracture or plain film evidence of avascular necrosis. The pubic symphysis and SI joints are intact. No pelvic fractures or bone lesions. An IUD is noted in the upper central pelvis. Right femur: Advanced knee joint degenerative changes for the patient's age. No acute bony abnormality involving the femur. IMPRESSION: Hip and knee joint degenerative changes but no acute bony findings. Electronically Signed   By: Rudie MeyerP.  Gallerani M.D.   On: 05/29/2015 18:36   Dg Femur, Min 2 Views Right  05/29/2015  CLINICAL DATA:  Burning right hip pain for  6 months. Pain radiates down the right thigh. EXAM: RIGHT FEMUR 2 VIEWS; PELVIS - 1-2 VIEW COMPARISON:  None. FINDINGS: Pelvis: Both hips are normally located. Moderate degenerative changes for the patient's age. No fracture or plain film evidence of avascular necrosis. The pubic symphysis and SI joints are intact. No pelvic fractures or bone lesions. An IUD is noted in the upper central pelvis. Right femur: Advanced knee joint degenerative changes for the patient's age. No acute bony abnormality involving the femur. IMPRESSION: Hip and knee joint degenerative changes but no acute bony findings. Electronically Signed   By: Rudie Meyer M.D.   On: 05/29/2015 18:36      EKG Interpretation None      MDM   Final diagnoses:  Sciatica of right side   Patient reports 6 months of pain. At this point and there is no neurovascular deficit present. There is no evidence of soft tissue abnormality other than varicose veins. There are no areas of erythema or induration. X-rays are obtained to rule out evident bony anomalies or tumors. None were identified. At this  time the patient is counseled to follow up with outpatient orthopedics for assessment of response to treatment and determination of any further needed diagnostic studies based on patient's condition and response to treatment. Current findings are very suggestive of sciatica.    Arby Barrette, MD 05/29/15 [phone]

## 2016-10-25 ENCOUNTER — Ambulatory Visit: Payer: Self-pay | Admitting: Obstetrics and Gynecology

## 2017-08-08 ENCOUNTER — Emergency Department (HOSPITAL_BASED_OUTPATIENT_CLINIC_OR_DEPARTMENT_OTHER)
Admission: EM | Admit: 2017-08-08 | Discharge: 2017-08-08 | Disposition: A | Discharge status: Home / Self Care | Payer: Self-pay | Attending: Emergency Medicine | Admitting: Emergency Medicine

## 2017-08-08 ENCOUNTER — Other Ambulatory Visit: Payer: Self-pay

## 2017-08-08 ENCOUNTER — Encounter (HOSPITAL_BASED_OUTPATIENT_CLINIC_OR_DEPARTMENT_OTHER): Payer: Self-pay | Admitting: Emergency Medicine

## 2017-08-08 DIAGNOSIS — N390 Urinary tract infection, site not specified: Secondary | ICD-10-CM | POA: Insufficient documentation

## 2017-08-08 DIAGNOSIS — Z794 Long term (current) use of insulin: Secondary | ICD-10-CM | POA: Insufficient documentation

## 2017-08-08 DIAGNOSIS — Z79899 Other long term (current) drug therapy: Secondary | ICD-10-CM | POA: Insufficient documentation

## 2017-08-08 DIAGNOSIS — I10 Essential (primary) hypertension: Secondary | ICD-10-CM | POA: Insufficient documentation

## 2017-08-08 DIAGNOSIS — E119 Type 2 diabetes mellitus without complications: Secondary | ICD-10-CM | POA: Insufficient documentation

## 2017-08-08 DIAGNOSIS — J45909 Unspecified asthma, uncomplicated: Secondary | ICD-10-CM | POA: Insufficient documentation

## 2017-08-08 DIAGNOSIS — R319 Hematuria, unspecified: Secondary | ICD-10-CM | POA: Insufficient documentation

## 2017-08-08 LAB — URINALYSIS, ROUTINE W REFLEX MICROSCOPIC
BILIRUBIN URINE: NEGATIVE
Glucose, UA: >=500 mg/dL — AB
Ketones, ur: NEGATIVE mg/dL
Nitrite: NEGATIVE
Protein, ur: NEGATIVE mg/dL
Specific Gravity, Urine: 1.01 (ref 1.005–1.030)
pH: 7 (ref 5.0–8.0)

## 2017-08-08 LAB — URINALYSIS, MICROSCOPIC (REFLEX)

## 2017-08-08 LAB — PREGNANCY, URINE: PREG TEST UR: NEGATIVE

## 2017-08-08 LAB — CBG MONITORING, ED: Glucose-Capillary: 348 mg/dL — ABNORMAL HIGH (ref 65–99)

## 2017-08-08 MED ORDER — CEPHALEXIN 500 MG PO CAPS: 500.0000 mg | ORAL_CAPSULE | Freq: Two times a day (BID) | ORAL | 0 refills | Status: AC

## 2017-08-08 NOTE — Discharge Instructions (Signed)
You were seen in the emergency department for a urinary tract infection. Please complete your entire course of antibiotics.   Please continue to follow with your regular doctor for your elevated blood sugars and be sure to take your prescribed medications daily.

## 2017-08-08 NOTE — ED Provider Notes (Signed)
[facility] EMERGENCY DEPARTMENT Provider Note   CSN: [id number] Arrival date & time: 08/08/17  1720     History   Chief Complaint Chief Complaint  Patient presents with  . Urinary Frequency    HPI Denise Solis is a 47 y.o. female presenting with dysuria and increased urinary frequency x7 days. Symptoms have been gradually worsening over the last 7 days. No fevers, no back pain. No abnormal vaginal discharge, pruritis or odor. No nausea or vomiting.  Patient is a diabetic and reports sugars have not been controlled. She did not take insulin today, and also endorses that she did not check her sugars today.  Past Medical History:  Diagnosis Date  . Asthma   . Diabetes mellitus without complication (HCC)   . Hypertension     There are no active problems to display for this patient.   Past Surgical History:  Procedure Laterality Date  . CESAREAN SECTION      OB History    No data available      Home Medications    Prior to Admission medications   Medication Sig Start Date End Date Taking? Authorizing Provider  albuterol (PROVENTIL HFA;VENTOLIN HFA) 108 (90 BASE) MCG/ACT inhaler Inhale into the lungs every 6 (six) hours as needed for wheezing or shortness of breath.    [provider]  amLODipine (NORVASC) 5 MG tablet Take 5 mg by mouth daily.    [provider]  beclomethasone (QVAR) 40 MCG/ACT inhaler Inhale into the lungs 2 (two) times daily.    [provider]  cephALEXin (KEFLEX) 500 MG capsule Take 1 capsule (500 mg total) by mouth 2 (two) times daily for 7 days. 08/08/17 08/15/17  Howard Pouch, MD  ferrous sulfate 325 (65 FE) MG tablet Take 325 mg by mouth daily with breakfast.    [provider]  gabapentin (NEURONTIN) 300 MG capsule Take 300 mg by mouth 3 (three) times daily.    [provider]  glipiZIDE (GLUCOTROL XL) 10 MG 24 hr tablet Take 10 mg by mouth daily with breakfast.    [provider]    HYDROcodone-acetaminophen (NORCO/VICODIN) 5-325 MG tablet Take 1-2 tablets by mouth every 4 (four) hours as needed for moderate pain or severe pain. 05/29/15   Arby Barrette, MD  insulin aspart (NOVOLOG) 100 UNIT/ML injection Inject into the skin as needed for high blood sugar.    [provider]  insulin detemir (LEVEMIR) 100 UNIT/ML injection Inject into the skin at bedtime.    [provider]  losartan (COZAAR) 50 MG tablet Take 50 mg by mouth daily.    [provider]  meloxicam (MOBIC) 7.5 MG tablet Take 7.5 mg by mouth daily.    [provider]  methylPREDNISolone (MEDROL DOSEPAK) 4 MG TBPK tablet Take by mouth taper from 4 doses each day to 1 dose and stop. 05/29/15   Arby Barrette, MD  oxyCODONE-acetaminophen (PERCOCET/ROXICET) 5-325 MG per tablet Take 1-2 tablets by mouth every 6 (six) hours as needed for severe pain. 07/23/14   Mabe, Latanya Maudlin, MD  penicillin v potassium (VEETID) 500 MG tablet Take 1 tablet (500 mg total) by mouth 3 (three) times daily. 07/23/14   Mabe, Latanya Maudlin, MD  predniSONE (DELTASONE) 20 MG tablet Take 3 tablets (60 mg total) by mouth daily with breakfast. X 3 days 01/09/14   Street, [place], PA-C  ranitidine (ZANTAC) 150 MG tablet Take 150 mg by mouth 2 (two) times daily.    [provider]  sulfamethoxazole-trimethoprim (BACTRIM DS,SEPTRA DS) 800-160 MG per tablet Take 1 tablet by mouth 2 (two) times daily.    [provider]  traMADol (ULTRAM) 50 MG tablet Take 1 tablet (50 mg total) by mouth every 6 (six) hours as needed. 02/13/15   Rolan Bucco, MD    Family History No family history on file.  Social History Social History   Tobacco Use  . Smoking status: Never Smoker  Substance Use Topics  . Alcohol use: No  . Drug use: No     Allergies   Patient has no known allergies.   Review of Systems Review of Systems See HPI for ROS   Physical Exam Updated Vital Signs BP (!) 188/106 (BP Location:  Right Arm)   Pulse 100   Temp 98.4 F (36.9 C) (Oral)   Resp 20   Ht 5\' 1"  (1.549 m)   Wt 91.2 kg (201 lb)   LMP 07/31/2017 (Approximate)   SpO2 99%   BMI 37.98 kg/m   Physical Exam  Constitutional: She appears well-developed and well-nourished. No distress.  HENT:  Head: Normocephalic and atraumatic.  Cardiovascular: Normal rate and regular rhythm. Exam reveals no gallop and no friction rub.  No murmur heard. Pulmonary/Chest: Effort normal and breath sounds normal. She has no wheezes. She exhibits no tenderness.  Abdominal: Soft. She exhibits no distension. There is no tenderness.  Genitourinary:  Genitourinary Comments: No CVA tenderness  Skin: Skin is warm and dry.     ED Treatments / Results  Labs (all labs ordered are listed, but only abnormal results are displayed) Labs Reviewed  URINALYSIS, ROUTINE W REFLEX MICROSCOPIC - Abnormal; Notable for the following components:      Result Value   Glucose, UA >=500 (*)    Hgb urine dipstick SMALL (*)    Leukocytes, UA TRACE (*)    All other components within normal limits  URINALYSIS, MICROSCOPIC (REFLEX) - Abnormal; Notable for the following components:   Bacteria, UA MANY (*)    Squamous Epithelial / LPF 0-5 (*)    All other components within normal limits  CBG MONITORING, ED - Abnormal; Notable for the following components:   Glucose-Capillary 348 (*)    All other components within normal limits  PREGNANCY, URINE    EKG  EKG Interpretation None       Radiology No results found.  Procedures Procedures (including critical care time)  Medications Ordered in ED Medications - No data to display   Initial Impression / Assessment and Plan / ED Course  I have reviewed the triage vital signs and the nursing notes.  Pertinent labs & imaging results that were available during my care of the patient were reviewed by me and considered in my medical decision making (see chart for details).    47 year old with  multiple medical issues presenting with urinary symptoms and UA consistent with UTI. No red flag symptoms with UTI. Patient treated with Keflex. Additionally checked CBG due to glucose in urine, result was 348. Encouraged patient to take insulin when she gets home from hospital.  Follow up with PCP.  Final Clinical Impressions(s) / ED Diagnoses   Final diagnoses:  Urinary tract infection without hematuria, site unspecified    ED Discharge Orders        Ordered    cephALEXin (KEFLEX) 500 MG capsule  2 times daily     08/08/17 2055       Howard Pouch, MD 08/08/17 2214  Howard PouchFeng, Terrill Wauters, MD 08/08/17 2214    Tegeler, Canary Brimhristopher J, MD 08/09/17 [phone]

## 2017-08-08 NOTE — ED Triage Notes (Signed)
Urinary frequency and "I feel like my bottom is going to fall out."

## 2017-08-08 NOTE — ED Notes (Signed)
ED Provider at bedside. 

## 2021-01-28 ENCOUNTER — Emergency Department (HOSPITAL_BASED_OUTPATIENT_CLINIC_OR_DEPARTMENT_OTHER): Payer: BLUE CROSS/BLUE SHIELD

## 2021-01-28 ENCOUNTER — Emergency Department (HOSPITAL_BASED_OUTPATIENT_CLINIC_OR_DEPARTMENT_OTHER)
Admission: EM | Admit: 2021-01-28 | Discharge: 2021-01-28 | Disposition: A | Discharge status: Home / Self Care | Payer: BLUE CROSS/BLUE SHIELD | Attending: Emergency Medicine | Admitting: Emergency Medicine

## 2021-01-28 ENCOUNTER — Other Ambulatory Visit: Payer: Self-pay

## 2021-01-28 ENCOUNTER — Encounter (HOSPITAL_BASED_OUTPATIENT_CLINIC_OR_DEPARTMENT_OTHER): Payer: Self-pay

## 2021-01-28 DIAGNOSIS — R42 Dizziness and giddiness: Secondary | ICD-10-CM | POA: Insufficient documentation

## 2021-01-28 DIAGNOSIS — Z7951 Long term (current) use of inhaled steroids: Secondary | ICD-10-CM | POA: Insufficient documentation

## 2021-01-28 DIAGNOSIS — M25512 Pain in left shoulder: Secondary | ICD-10-CM | POA: Insufficient documentation

## 2021-01-28 DIAGNOSIS — M549 Dorsalgia, unspecified: Secondary | ICD-10-CM | POA: Insufficient documentation

## 2021-01-28 DIAGNOSIS — R0789 Other chest pain: Secondary | ICD-10-CM | POA: Diagnosis not present

## 2021-01-28 DIAGNOSIS — Z79899 Other long term (current) drug therapy: Secondary | ICD-10-CM | POA: Insufficient documentation

## 2021-01-28 DIAGNOSIS — Z7984 Long term (current) use of oral hypoglycemic drugs: Secondary | ICD-10-CM | POA: Diagnosis not present

## 2021-01-28 DIAGNOSIS — Z794 Long term (current) use of insulin: Secondary | ICD-10-CM | POA: Diagnosis not present

## 2021-01-28 DIAGNOSIS — R110 Nausea: Secondary | ICD-10-CM | POA: Insufficient documentation

## 2021-01-28 DIAGNOSIS — R0602 Shortness of breath: Secondary | ICD-10-CM | POA: Diagnosis present

## 2021-01-28 DIAGNOSIS — I10 Essential (primary) hypertension: Secondary | ICD-10-CM | POA: Diagnosis not present

## 2021-01-28 DIAGNOSIS — E119 Type 2 diabetes mellitus without complications: Secondary | ICD-10-CM | POA: Diagnosis not present

## 2021-01-28 DIAGNOSIS — J45909 Unspecified asthma, uncomplicated: Secondary | ICD-10-CM | POA: Diagnosis not present

## 2021-01-28 LAB — CBC WITH DIFFERENTIAL/PLATELET
Abs Immature Granulocytes: 0.04 10*3/uL (ref 0.00–0.07)
Basophils Absolute: 0 10*3/uL (ref 0.0–0.1)
Basophils Relative: 0 %
Eosinophils Absolute: 0 10*3/uL (ref 0.0–0.5)
Eosinophils Relative: 0 %
HCT: 37 % (ref 36.0–46.0)
Hemoglobin: 12.3 g/dL (ref 12.0–15.0)
Immature Granulocytes: 0 %
Lymphocytes Relative: 18 %
Lymphs Abs: 1.9 10*3/uL (ref 0.7–4.0)
MCH: 29.6 pg (ref 26.0–34.0)
MCHC: 33.2 g/dL (ref 30.0–36.0)
MCV: 88.9 fL (ref 80.0–100.0)
Monocytes Absolute: 1 10*3/uL (ref 0.1–1.0)
Monocytes Relative: 10 %
Neutro Abs: 7.4 10*3/uL (ref 1.7–7.7)
Neutrophils Relative %: 72 %
Platelets: 166 10*3/uL (ref 150–400)
RBC: 4.16 MIL/uL (ref 3.87–5.11)
RDW: 12.5 % (ref 11.5–15.5)
WBC: 10.4 10*3/uL (ref 4.0–10.5)
nRBC: 0 % (ref 0.0–0.2)

## 2021-01-28 LAB — BASIC METABOLIC PANEL
Anion gap: 9 (ref 5–15)
BUN: 30 mg/dL — ABNORMAL HIGH (ref 6–20)
CO2: 27 mmol/L (ref 22–32)
Calcium: 9.4 mg/dL (ref 8.9–10.3)
Chloride: 92 mmol/L — ABNORMAL LOW (ref 98–111)
Creatinine, Ser: 1.07 mg/dL — ABNORMAL HIGH (ref 0.44–1.00)
GFR, Estimated: >60 mL/min (ref >60)
Glucose, Bld: 568 mg/dL (ref 70–99)
Potassium: 4.1 mmol/L (ref 3.5–5.1)
Sodium: 128 mmol/L — ABNORMAL LOW (ref 135–145)

## 2021-01-28 LAB — CBG MONITORING, ED: Glucose-Capillary: 357 mg/dL — ABNORMAL HIGH (ref 70–99)

## 2021-01-28 LAB — TROPONIN I (HIGH SENSITIVITY)
Troponin I (High Sensitivity): 6 ng/L (ref <18)
Troponin I (High Sensitivity): 7 ng/L (ref <18)

## 2021-01-28 LAB — D-DIMER, QUANTITATIVE: D-Dimer, Quant: 0.5 ug/mL-FEU (ref 0.00–0.50)

## 2021-01-28 LAB — PREGNANCY, URINE: Preg Test, Ur: NEGATIVE

## 2021-01-28 MED ORDER — SODIUM CHLORIDE 0.9 % IV BOLUS
1000.0000 mL | Freq: Once | INTRAVENOUS | Status: AC
  Administered 2021-01-28: 1000 mL via INTRAVENOUS

## 2021-01-28 NOTE — ED Provider Notes (Signed)
[facility] EMERGENCY DEPARTMENT Provider Note   CSN: [id number] Arrival date & time: 01/28/21  [id number]     History Chief Complaint  Patient presents with   Chest Pain   Shortness of Breath    Denise CanalCynthia Geers is a 50 y.o. female with a past medical history significant for asthma, hypertension, and diabetes who presents to the ED due to multiple complaints of shortness of breath, left-sided chest pain, and dizziness that have been present for the past 3 months.  Patient states left-sided chest pain has worsened over the past week.  She describes chest pain as a squeezing sensation.  She also endorses left-sided upper back pain.  No pleuritic nature of chest pain.  Patient states pain is worse with palpation and movement.  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  Chest pain associated with occasional nausea.  She also endorses persistent shortness of breath with exertion for the past 3 months.  She has a history of asthma.  Denies wheezing.  No fever or chills.  Denies history of blood clots, recent surgeries or recent long immobilizations.  She has a Nexplanon for birth control.  Denies history of MI or CVA.  Denies tobacco use.  Patient also states she has been feeling "off balance" for the past few months when she walks.  Patient states she has been "stumbling around".  Patient denies any recent falls.  Denies speech changes, visual changes, and unilateral weakness.  Patient was evaluated by her PCP on Monday and discharged with topical cream for left-sided chest and back which was suspected to be MSK etiology.  Patient denies associated lower extremity edema.  No treatment prior to arrival.  No aggravating or alleviating symptoms.   History obtained from patient and past medical records. No interpreter used during encounter.       Past Medical History:  Diagnosis Date   Asthma    Diabetes mellitus without complication (HCC)    Hypertension     There are no problems to  display for this patient.   Past Surgical History:  Procedure Laterality Date   CESAREAN SECTION       OB History   No obstetric history on file.     No family history on file.  Social History   Tobacco Use   Smoking status: Never  Substance Use Topics   Alcohol use: No   Drug use: No    Home Medications Prior to Admission medications   Medication Sig Start Date End Date Taking? Authorizing Provider  albuterol (PROVENTIL HFA;VENTOLIN HFA) 108 (90 BASE) MCG/ACT inhaler Inhale into the lungs every 6 (six) hours as needed for wheezing or shortness of breath.    [provider]  amLODipine (NORVASC) 5 MG tablet Take 5 mg by mouth daily.    [provider]  beclomethasone (QVAR) 40 MCG/ACT inhaler Inhale into the lungs 2 (two) times daily.    [provider]  ferrous sulfate 325 (65 FE) MG tablet Take 325 mg by mouth daily with breakfast.    [provider]  gabapentin (NEURONTIN) 300 MG capsule Take 300 mg by mouth 3 (three) times daily.    [provider]  glipiZIDE (GLUCOTROL XL) 10 MG 24 hr tablet Take 10 mg by mouth daily with breakfast.    [provider]  HYDROcodone-acetaminophen (NORCO/VICODIN) 5-325 MG tablet Take 1-2 tablets by mouth every 4 (four) hours as needed for moderate pain or severe pain. 05/29/15   Arby BarrettePfeiffer, Marcy, MD  insulin aspart (NOVOLOG) 100 UNIT/ML injection Inject into the skin as needed for high blood sugar.    [provider]  insulin detemir (LEVEMIR) 100 UNIT/ML injection Inject into the skin at bedtime.    [provider]  losartan (COZAAR) 50 MG tablet Take 50 mg by mouth daily.    [provider]  meloxicam (MOBIC) 7.5 MG tablet Take 7.5 mg by mouth daily.    [provider]  methylPREDNISolone (MEDROL DOSEPAK) 4 MG TBPK tablet Take by mouth taper from 4 doses each day to 1 dose and stop. 05/29/15   Arby Barrette, MD  oxyCODONE-acetaminophen  (PERCOCET/ROXICET) 5-325 MG per tablet Take 1-2 tablets by mouth every 6 (six) hours as needed for severe pain. 07/23/14   Mabe, Latanya Maudlin, MD  penicillin v potassium (VEETID) 500 MG tablet Take 1 tablet (500 mg total) by mouth 3 (three) times daily. 07/23/14   Mabe, Latanya Maudlin, MD  predniSONE (DELTASONE) 20 MG tablet Take 3 tablets (60 mg total) by mouth daily with breakfast. X 3 days 01/09/14   Street, [place], PA-C  ranitidine (ZANTAC) 150 MG tablet Take 150 mg by mouth 2 (two) times daily.    [provider]  sulfamethoxazole-trimethoprim (BACTRIM DS,SEPTRA DS) 800-160 MG per tablet Take 1 tablet by mouth 2 (two) times daily.    [provider]  traMADol (ULTRAM) 50 MG tablet Take 1 tablet (50 mg total) by mouth every 6 (six) hours as needed. 02/13/15   Rolan Bucco, MD    Allergies    Patient has no known allergies.  Review of Systems   Review of Systems  Constitutional:  Negative for chills and fever.  Eyes:  Negative for visual disturbance.  Respiratory:  Positive for shortness of breath. Negative for cough.   Cardiovascular:  Positive for chest pain. Negative for leg swelling.  Neurological:  Positive for dizziness. Negative for facial asymmetry, weakness and numbness.  All other systems reviewed and are negative.  Physical Exam Updated Vital Signs BP (!) 166/99   Pulse 100   Temp 98.2 F (36.8 C) (Rectal)   Resp (!) 22   Ht 5\' 1"  (1.549 m)   Wt 92.1 kg   SpO2 100%   BMI 38.36 kg/m   Physical Exam Vitals and nursing note reviewed.  Constitutional:      General: She is not in acute distress.    Appearance: She is not ill-appearing.  HENT:     Head: Normocephalic.  Eyes:     Pupils: Pupils are equal, round, and reactive to light.  Cardiovascular:     Rate and Rhythm: Normal rate and regular rhythm.     Pulses: Normal pulses.     Heart sounds: Normal heart sounds. No murmur heard.   No friction rub. No gallop.  Pulmonary:     Effort: Pulmonary effort  is normal.     Breath sounds: Normal breath sounds.  Chest:     Comments: Reproducible left-sided chest wall tenderness.  No crepitus or deformity. Abdominal:     General: Abdomen is flat. There is no distension.     Palpations: Abdomen is soft.     Tenderness: There is no abdominal tenderness. There is no guarding or rebound.  Musculoskeletal:        General: Normal range of motion.     Cervical back: Neck supple.     Comments: Reproducible tenderness over left trapezius muscle.  No cervical, thoracic, or lumbar midline tenderness.  No lower extremity edema.  Negative Homan sign bilaterally.  Skin:    General: Skin is warm and dry.  Neurological:     General: No focal deficit present.     Mental Status: She is alert.     Comments: Speech is clear, able to follow commands CN III-XII intact Normal strength in upper and lower extremities bilaterally including dorsiflexion and plantar flexion, strong and equal grip strength Sensation grossly intact throughout Moves extremities without ataxia, coordination intact No pronator drift Appears slightly off balance when starting to walk. No leaning.   Psychiatric:        Mood and Affect: Mood normal.        Behavior: Behavior normal.    ED Results / Procedures / Treatments   Labs (all labs ordered are listed, but only abnormal results are displayed) Labs Reviewed  BASIC METABOLIC PANEL - Abnormal; Notable for the following components:      Result Value   Sodium 128 (*)    Chloride 92 (*)    Glucose, Bld 568 (*)    BUN 30 (*)    Creatinine, Ser 1.07 (*)    All other components within normal limits  CBG MONITORING, ED - Abnormal; Notable for the following components:   Glucose-Capillary 357 (*)    All other components within normal limits  CBC WITH DIFFERENTIAL/PLATELET  PREGNANCY, URINE  D-DIMER, QUANTITATIVE  TROPONIN I (HIGH SENSITIVITY)  TROPONIN I (HIGH SENSITIVITY)    EKG EKG Interpretation  Date/Time:  Saturday January 28 2021 09:59:32 EDT Ventricular Rate:  88 PR Interval:  129 QRS Duration: 78 QT Interval:  355 QTC Calculation: 430 R Axis:   68 Text Interpretation: Sinus rhythm Consider left ventricular hypertrophy No old tracing to compare Confirmed by Rolan Bucco [phone]) on 01/28/2021 10:02:35 AM  Radiology DG Chest 2 View  Result Date: 01/28/2021 CLINICAL DATA:  Shortness of breath. EXAM: CHEST - 2 VIEW COMPARISON:  08/03/2019 FINDINGS: Grossly unchanged cardiac silhouette and mediastinal contours. Improved aeration of the lungs with resolved bilateral nodular airspace opacities. No new focal airspace opacities. There is persistent mild elevation/eventration right hemidiaphragm. No pleural effusion or pneumothorax. No evidence of edema. No acute osseous abnormalities. IMPRESSION: 1.  No acute cardiopulmonary disease. 2. Interval resolution of bilateral airspace opacities suggestive of resolved multifocal infection. Electronically Signed   By: Simonne Come M.D.   On: 01/28/2021 10:55   CT Head Wo Contrast  Result Date: 01/28/2021 CLINICAL DATA:  Dizziness and headache for 3 days, high blood pressure. EXAM: CT HEAD WITHOUT CONTRAST TECHNIQUE: Contiguous axial images were obtained from the base of the skull through the vertex without intravenous contrast. COMPARISON:  None. FINDINGS: Brain: No hydrocephalus. All areas of the brain demonstrate grossly normal gray-white matter differentiation. No mass, hemorrhage, edema or other evidence of acute parenchymal abnormality. No extra-axial hemorrhage. Vascular: Chronic calcified atherosclerotic changes of the large vessels at the skull base. No unexpected hyperdense vessel. Skull: Normal. Negative for fracture or focal lesion. Sinuses/Orbits: No acute finding. Other: None. IMPRESSION: Negative head CT. No intracranial mass, hemorrhage or edema. Electronically Signed   By: Bary Richard M.D.   On: 01/28/2021 11:16   MR BRAIN WO CONTRAST  Result Date:  01/28/2021 CLINICAL DATA:  Dizziness. EXAM: MRI HEAD WITHOUT CONTRAST TECHNIQUE: Multiplanar, multiecho pulse sequences of the brain and surrounding structures were obtained without intravenous contrast. COMPARISON:  Same day CT head. FINDINGS: Mildly motion limited exam.  Within this limitation: Brain: No acute infarction, hemorrhage, hydrocephalus, extra-axial collection or mass lesion.  Mild T2/FLAIR hyperintensities within the white matter. Vascular: Major arterial flow voids are maintained at the skull base. Skull and upper cervical spine: Normal marrow signal. Degenerative changes at the craniocervical junction. Sinuses/Orbits: Clear sinuses.  Unremarkable orbits. Other: No mastoid effusions. IMPRESSION: 1. No evidence of acute intracranial abnormality on this motion limited exam. 2. Mild T2/FLAIR hyperintensities within the white matter, which are nonspecific but potentially related to mild chronic microvascular ischemic disease. Electronically Signed   By: Feliberto Harts MD   On: 01/28/2021 14:59    Procedures Procedures   Medications Ordered in ED Medications  sodium chloride 0.9 % bolus 1,000 mL (0 mLs Intravenous Stopped 01/28/21 1258)    ED Course  I have reviewed the triage vital signs and the nursing notes.  Pertinent labs & imaging results that were available during my care of the patient were reviewed by me and considered in my medical decision making (see chart for details).  Clinical Course as of 01/28/21 1545  Sat Jan 28, 2021  1505 D-Dimer, Quant: 0.50 [CA]    Clinical Course User Index [CA] Mannie Stabile, PA-C   MDM Rules/Calculators/A&P                         50 year old female presents to the ED due to left-sided chest pain, shortness of breath, and dizziness that has been present for the past 3 months.  Patient states left-sided chest pain has worsened over the past week.  Patient describes dizziness as feeling off balance and feels like she is stumbling around.   Denies recent falls.  Upon arrival, patient afebrile, not tachycardic or hypoxic.  Patient nontoxic-appearing.  Physical exam significant for reproducible left-sided chest wall tenderness without crepitus or deformity.  Reproducible tenderness over left trapezius muscle.  No lower extremity edema.  No neurological deficits except for mild ataxia when ambulating. CT head given persistent disequilibrium. Cardiac markers to rule out cardiac etiology. Low suspicion for PE or dissection given longevity of symptoms.   CBC unremarkable no leukocytosis and normal hemoglobin.  BMP significant for hyperglycemia at 568.  No anion gap.  Low suspicion for DKA.  Mild elevation in creatinine and BUN.  IV fluids given.  Mild hyponatremia 128.  Patient's glucose improved to 357 after IV fluids.  Delta troponin flat.  Low suspicion of ACS.  Pregnancy test negative.  EKG demonstrates normal sinus rhythm no signs of acute ischemia.  CXR personally reviewed which demonstrates: IMPRESSION:  1.  No acute cardiopulmonary disease.  2. Interval resolution of bilateral airspace opacities suggestive of  resolved multifocal infection.   1:37 PM Patient's HR now tachycardic, with chest pain and birth control, will obtain d-dimer to rule out PE.   D-dimer normal.  Low suspicion for PE/DVT.  Given reproducible nature on exam, suspect MSK etiology of chest pain.  Low suspicion for dissection.  CT had negative.  Given patient's mild ataxia on exam will obtain MRI brain.   MRI brain demonstrates: IMPRESSION:  1. No evidence of acute intracranial abnormality on this motion  limited exam.  2. Mild T2/FLAIR hyperintensities within the white matter, which are  nonspecific but potentially related to mild chronic microvascular  ischemic disease.    Discussed MRI findings with Dr. Amada Jupiter with neurology who notes those are findings consistent with her history of DM and HTN. Will have patient follow-up with neurology in outpatient  setting for further evaluation of disequilibrium.  Shortness of breath could be related to  resolved multifocal pneumonia.  No infectious symptoms to suggest current pneumonia. No wheeze, low suspicion for COPD/asthma. Advised patient to follow-up with PCP if symptoms do not improved within the next week. Advised patient to recheck glucose frequently to ensure diabetes is well controlled. Strict ED precautions discussed with patient. Patient states understanding and agrees to plan. Patient discharged home in no acute distress and stable vitals. Final Clinical Impression(s) / ED Diagnoses Final diagnoses:  Atypical chest pain  Shortness of breath  Disequilibrium    Rx / DC Orders ED Discharge Orders     None        Mannie Stabile, PA-C 01/28/21 1547    Rolan Bucco, MD 01/29/21 1500

## 2021-01-28 NOTE — ED Triage Notes (Signed)
L sided chest pain, started "last week". SOB approx 1 week. Loss of balance.

## 2021-01-28 NOTE — ED Notes (Signed)
Patient transported to CT 

## 2021-01-28 NOTE — Discharge Instructions (Addendum)
It was a pleasure taking care of you today.  As discussed, your MRI and CT head were normal.  Your cardiac labs did not show any damage to your heart.  Your chest x-ray showed old multifocal pneumonia.  I have included the number of the neurologist.  Please call Monday to schedule an appointment for further evaluation of your balance issues.  Please follow-up with PCP if symptoms not improved within the next week.  Return to the ER for any worsening symptoms.

## 2021-01-28 NOTE — ED Notes (Signed)
Pt ambulatory to waiting room. Pt verbalized understanding of discharge instructions.   

## 2021-01-28 NOTE — ED Notes (Signed)
Patient transported to MRI 

## 2021-12-01 IMAGING — CT CT HEAD W/O CM
3 series · 16 of 47 positions shown, 19 images · non-contrast
Comparison: None.

CLINICAL DATA: Dizziness and headache for 3 days, high blood
pressure.

EXAM:
CT HEAD WITHOUT CONTRAST
TECHNIQUE: Contiguous axial images were obtained from the base of the skull
through the vertex without intravenous contrast.

[Series 2: head wo · axial · 0.43mm/px · z∈[-185,-55]mm · 10 of 32 slices shown, 13 images]
[im 3/32  brain]
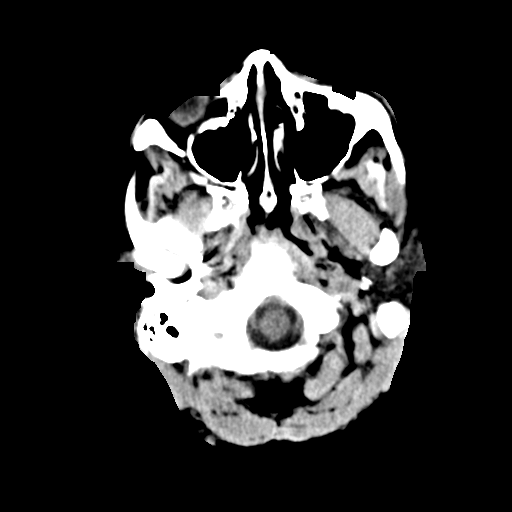
[im 3/32  bone]
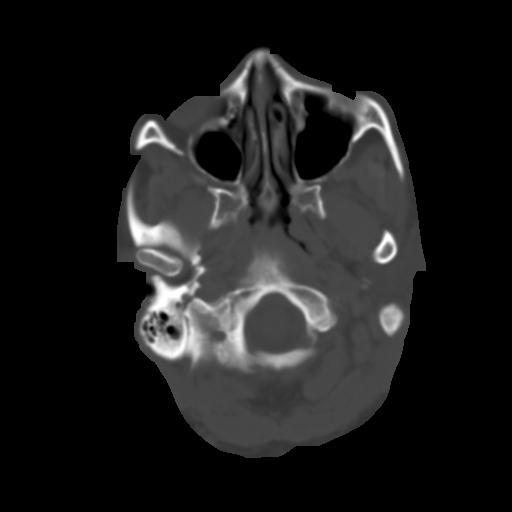
[im 6/32  brain]
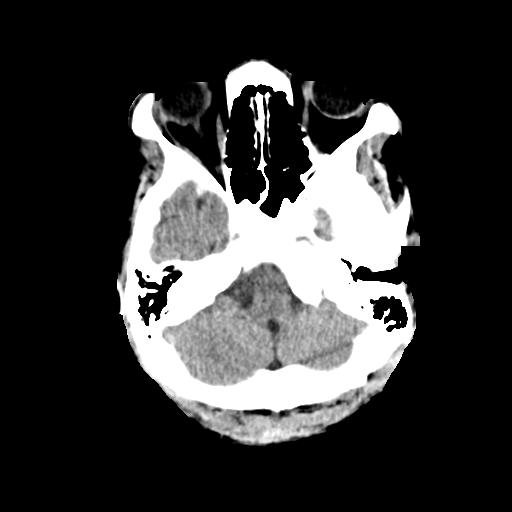
[im 9/32  brain]
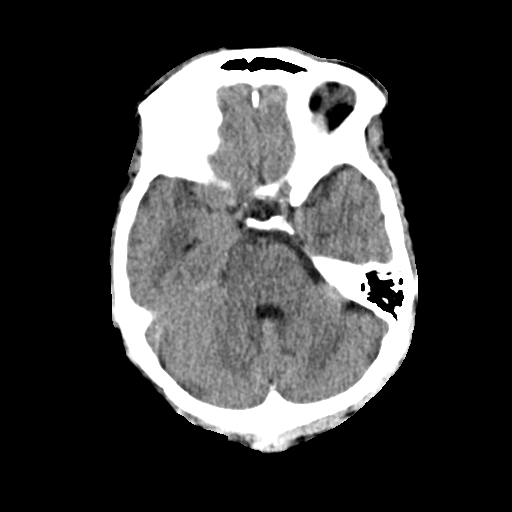
[im 11/32  brain]
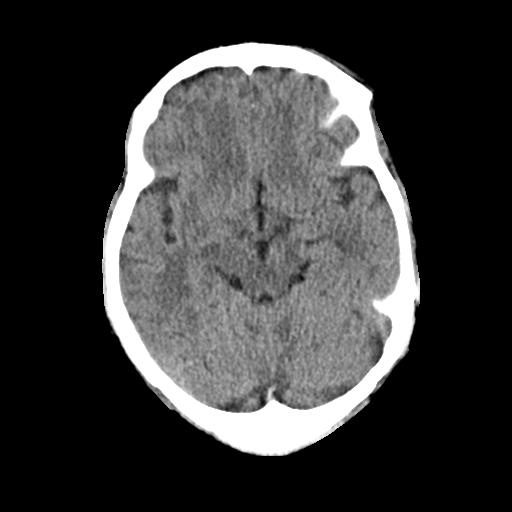
[im 14/32  brain]
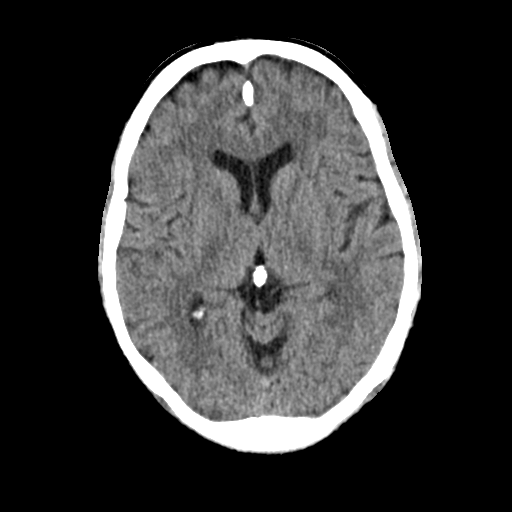
[im 14/32  bone]
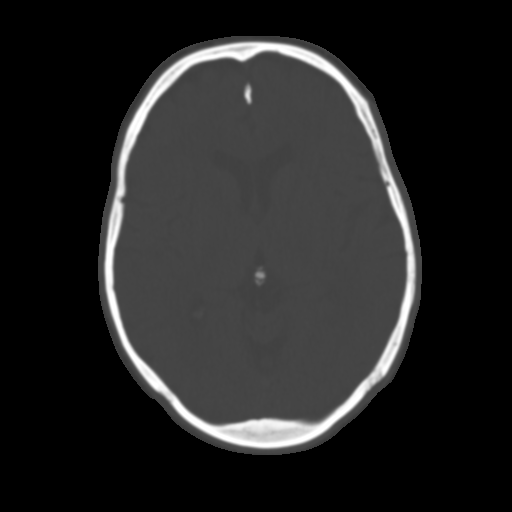
[im 18/32  brain]
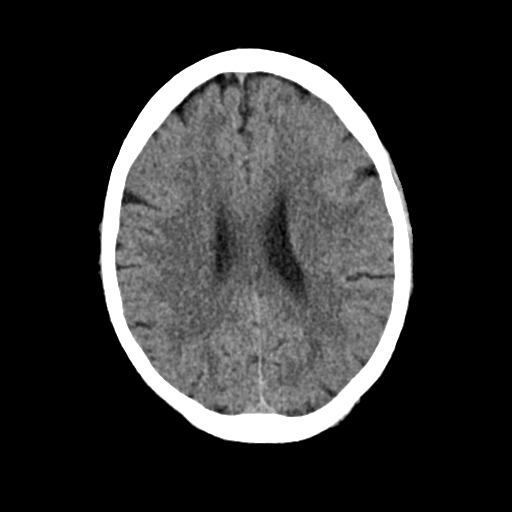
[im 21/32  brain]
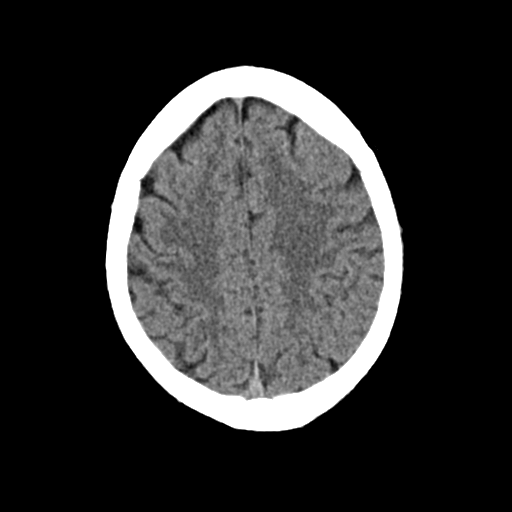
[im 24/32  brain]
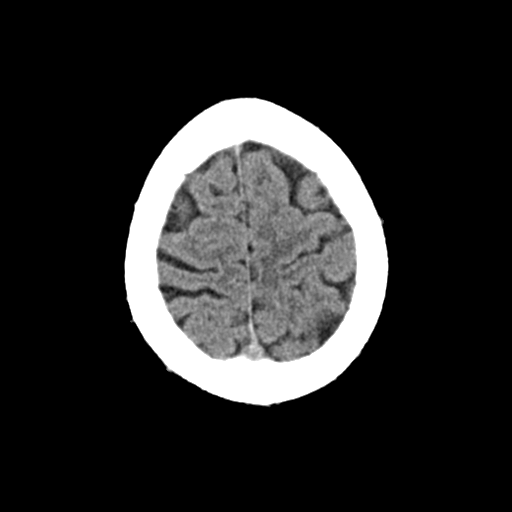
[im 26/32  brain]
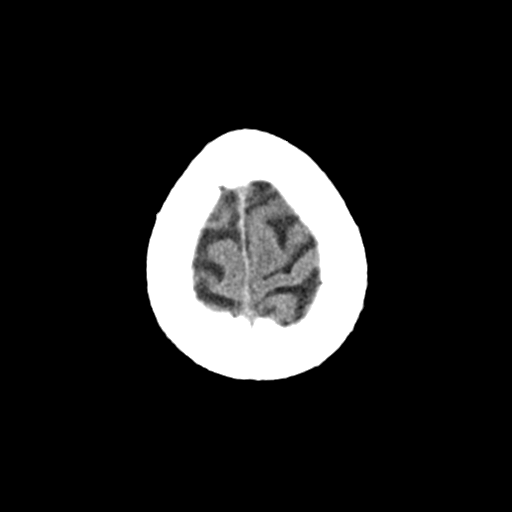
[im 26/32  bone]
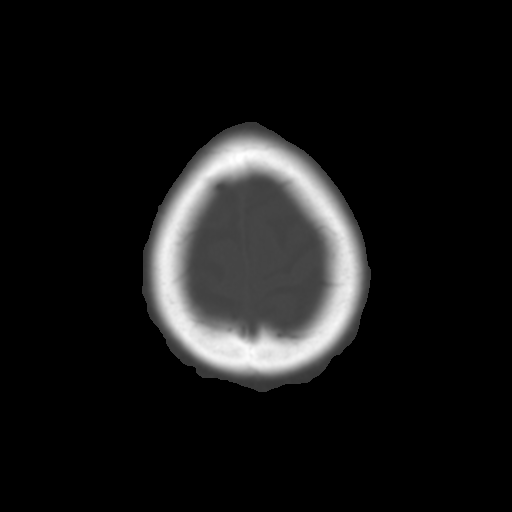
[im 29/32  brain]
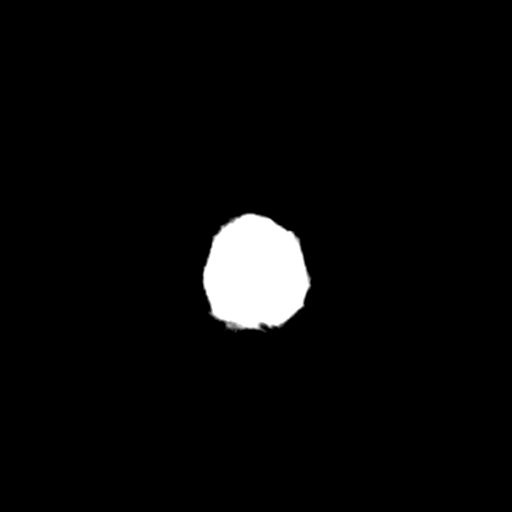

[Series 4: coronal soft · coronal · 0.34mm/px · 3 of 70 slices shown]
[im 24/70  brain]
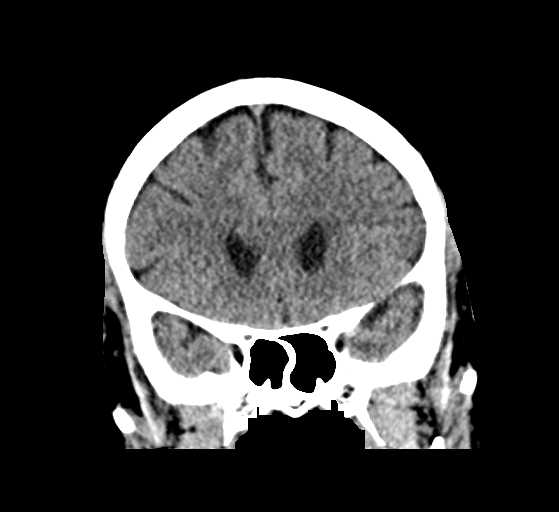
[im 31/70  brain]
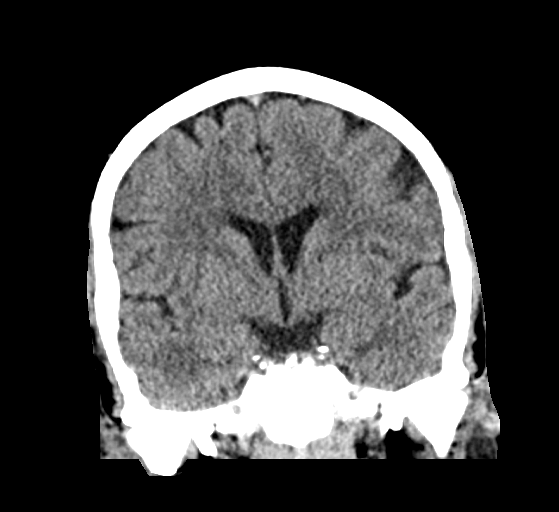
[im 39/70  brain]
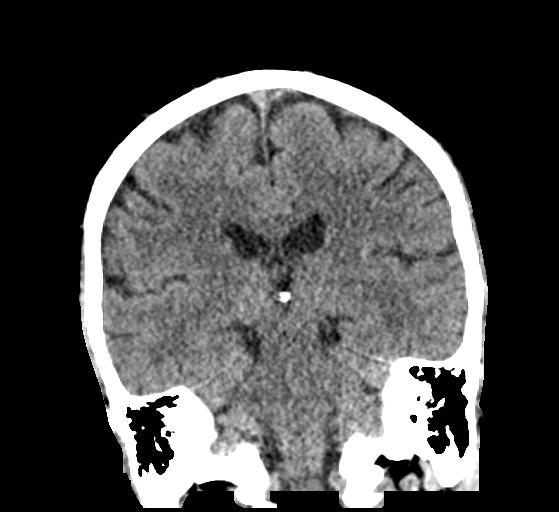

[Series 5: sag soft · sagittal · 0.34mm/px · 3 of 54 slices shown]
[im 18/54  brain]
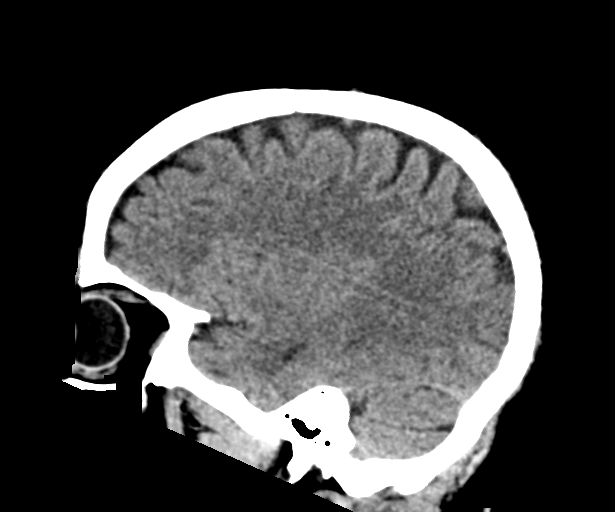
[im 27/54  brain]
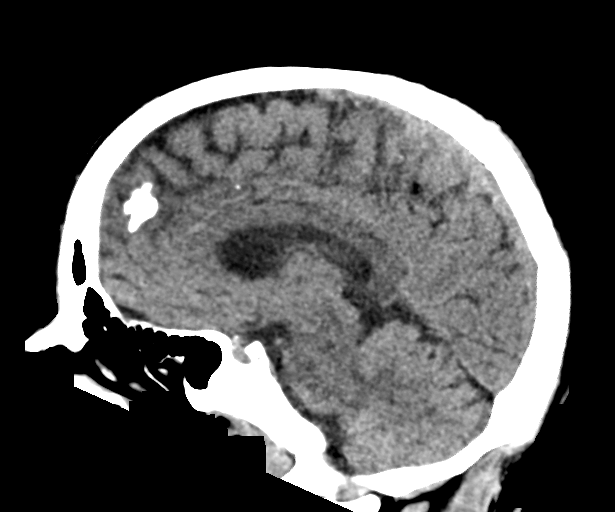
[im 36/54  brain]
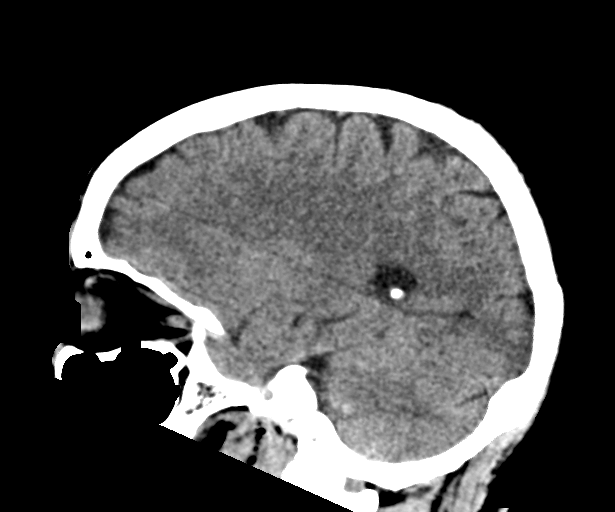

[16 of 47 positions shown; findings below may reference images not displayed]

FINDINGS: Brain: No hydrocephalus. All areas of the brain demonstrate grossly
normal gray-white matter differentiation. No mass, hemorrhage, edema
or other evidence of acute parenchymal abnormality. No extra-axial
hemorrhage.

Vascular: Chronic calcified atherosclerotic changes of the large
vessels at the skull base. No unexpected hyperdense vessel.

Skull: Normal. Negative for fracture or focal lesion.

Sinuses/Orbits: No acute finding.

Other: None.
IMPRESSION: Negative head CT. No intracranial mass, hemorrhage or edema.
